# Patient Record
Sex: Female | Born: 1985 | Race: White | Hispanic: Yes | Marital: Single | Smoking: Never smoker
Health system: Southern US, Community
[De-identification: ages and names within clinical notes are randomized; demographics above are authoritative.]

## PROBLEM LIST (undated history)

## (undated) ENCOUNTER — Inpatient Hospital Stay (HOSPITAL_COMMUNITY): Payer: Self-pay

## (undated) DIAGNOSIS — A048 Other specified bacterial intestinal infections: Secondary | ICD-10-CM

## (undated) DIAGNOSIS — Z789 Other specified health status: Secondary | ICD-10-CM

## (undated) HISTORY — PX: BREAST CYST EXCISION: SHX579

---

## 2009-01-09 DIAGNOSIS — A048 Other specified bacterial intestinal infections: Secondary | ICD-10-CM

## 2009-01-09 HISTORY — DX: Other specified bacterial intestinal infections: A04.8

## 2013-02-02 LAB — OB RESULTS CONSOLE GBS: STREP GROUP B AG: NEGATIVE

## 2013-07-08 ENCOUNTER — Encounter (HOSPITAL_COMMUNITY): Payer: Self-pay | Admitting: Emergency Medicine

## 2013-07-08 ENCOUNTER — Emergency Department (HOSPITAL_COMMUNITY)
Admission: EM | Admit: 2013-07-08 | Discharge: 2013-07-08 | Disposition: A | Discharge status: Home / Self Care | Payer: Medicaid Other | Attending: Emergency Medicine | Admitting: Emergency Medicine

## 2013-07-08 ENCOUNTER — Emergency Department (HOSPITAL_COMMUNITY): Payer: Medicaid Other

## 2013-07-08 DIAGNOSIS — N390 Urinary tract infection, site not specified: Secondary | ICD-10-CM | POA: Diagnosis not present

## 2013-07-08 DIAGNOSIS — O9989 Other specified diseases and conditions complicating pregnancy, childbirth and the puerperium: Secondary | ICD-10-CM | POA: Insufficient documentation

## 2013-07-08 DIAGNOSIS — R109 Unspecified abdominal pain: Secondary | ICD-10-CM

## 2013-07-08 DIAGNOSIS — R1031 Right lower quadrant pain: Secondary | ICD-10-CM | POA: Diagnosis not present

## 2013-07-08 DIAGNOSIS — O2390 Unspecified genitourinary tract infection in pregnancy, unspecified trimester: Secondary | ICD-10-CM | POA: Insufficient documentation

## 2013-07-08 DIAGNOSIS — N898 Other specified noninflammatory disorders of vagina: Secondary | ICD-10-CM | POA: Insufficient documentation

## 2013-07-08 DIAGNOSIS — J029 Acute pharyngitis, unspecified: Secondary | ICD-10-CM | POA: Insufficient documentation

## 2013-07-08 DIAGNOSIS — O26899 Other specified pregnancy related conditions, unspecified trimester: Secondary | ICD-10-CM

## 2013-07-08 LAB — URINE MICROSCOPIC-ADD ON

## 2013-07-08 LAB — POC URINE PREG, ED: PREG TEST UR: POSITIVE — AB

## 2013-07-08 LAB — URINALYSIS, ROUTINE W REFLEX MICROSCOPIC
BILIRUBIN URINE: NEGATIVE
Glucose, UA: NEGATIVE mg/dL
Hgb urine dipstick: NEGATIVE
Ketones, ur: NEGATIVE mg/dL
Nitrite: NEGATIVE
PH: 6 (ref 5.0–8.0)
Protein, ur: NEGATIVE mg/dL
Specific Gravity, Urine: 1.027 (ref 1.005–1.030)
Urobilinogen, UA: 0.2 mg/dL (ref 0.0–1.0)

## 2013-07-08 MED ORDER — LIDOCAINE VISCOUS 2 % MT SOLN
15.0000 mL | Freq: Once | OROMUCOSAL | Status: AC
  Administered 2013-07-08: 15 mL via OROMUCOSAL
  Filled 2013-07-08: qty 15

## 2013-07-08 MED ORDER — NITROFURANTOIN MONOHYD MACRO 100 MG PO CAPS: 100.0000 mg | ORAL_CAPSULE | Freq: Two times a day (BID) | ORAL | Status: DC

## 2013-07-08 MED ORDER — NITROFURANTOIN MONOHYD MACRO 100 MG PO CAPS
100.0000 mg | ORAL_CAPSULE | Freq: Once | ORAL | Status: AC
  Administered 2013-07-08: 100 mg via ORAL
  Filled 2013-07-08 (×2): qty 1

## 2013-07-08 NOTE — ED Notes (Addendum)
Pt back from us

## 2013-07-08 NOTE — ED Notes (Signed)
Patient transported to Ultrasound 

## 2013-07-08 NOTE — ED Notes (Signed)
Pt pulled out to hallway per charge nurse.

## 2013-07-08 NOTE — ED Notes (Signed)
Patient declines wheelchair at discharge.  Patient escorted to lobby by RN.  

## 2013-07-08 NOTE — ED Provider Notes (Signed)
CSN: [id number]     Arrival date & time 07/08/13  [id number] History   First MD Initiated Contact with Patient 07/08/13 1115     Chief Complaint  Patient presents with  . Sore Throat  . Otalgia  . Possible Pregnancy  . Vaginal Discharge      HPI Patient presents multiple complaints. Patient is her primary concern is no throat, ear pain, cough. Since symptom onset approximately 3 days ago, patient has not taken any medication for symptom relief. She denies fevers, chills, vomiting, diarrhea. She states that she was generally well prior to this. Patient also knowledge is a smoker.  Approximately 6 weeks ago, with one home pregnancy test was positive. She denies vaginal bleeding, does complain of mild right lower quadrant pain.  History reviewed. No pertinent past medical history. History reviewed. No pertinent past surgical history. No family history on file. History  Substance Use Topics  . Smoking status: Never Smoker   . Smokeless tobacco: Not on file  . Alcohol Use: No   OB History   Grav Para Term Preterm Abortions TAB SAB Ect Mult Living                 Review of Systems  Constitutional:       Per HPI, otherwise negative  HENT:       Per HPI, otherwise negative  Respiratory:       Per HPI, otherwise negative  Cardiovascular:       Per HPI, otherwise negative  Gastrointestinal: Negative for vomiting.  Endocrine:       Negative aside from HPI  Genitourinary:       Neg aside from HPI   Musculoskeletal:       Per HPI, otherwise negative  Skin: Negative.   Neurological: Negative for syncope.      Allergies  Review of patient's allergies indicates no known allergies.  Home Medications   Prior to Admission medications   Medication Sig Start Date End Date Taking? Authorizing Provider  Docosahexaenoic Acid (DHA PO) Take 2 capsules by mouth daily.   Yes Historical Provider, MD  Multiple Vitamin (MULTIVITAMIN WITH MINERALS) TABS tablet Take 1 tablet by mouth daily.    Yes Historical Provider, MD   BP 109/64  Pulse 76  Temp(Src) 98.5 F (36.9 C) (Oral)  Resp 10  Ht 5\' 9"  (1.753 m)  Wt 153 lb (69.4 kg)  BMI 22.58 kg/m2  SpO2 100% Physical Exam  Nursing note and vitals reviewed. Constitutional: She is oriented to person, place, and time. She appears well-developed and well-nourished. No distress.  HENT:  Head: Normocephalic and atraumatic.  Eyes: Conjunctivae and EOM are normal.  Cardiovascular: Normal rate and regular rhythm.   Pulmonary/Chest: Effort normal and breath sounds normal. No stridor. No respiratory distress.  Abdominal: She exhibits no distension. There is no hepatosplenomegaly. There is tenderness in the right lower quadrant. There is no rigidity, no rebound and no guarding.  Musculoskeletal: She exhibits no edema.  Neurological: She is alert and oriented to person, place, and time. No cranial nerve deficit.  Skin: Skin is warm and dry.  Psychiatric: She has a normal mood and affect.    ED Course  Procedures (including critical care time) Labs Review Labs Reviewed  URINALYSIS, ROUTINE W REFLEX MICROSCOPIC - Abnormal; Notable for the following:    APPearance CLOUDY (*)    Leukocytes, UA MODERATE (*)    All other components within normal limits  URINE MICROSCOPIC-ADD ON - Abnormal; Notable for  the following:    Squamous Epithelial / LPF MANY (*)    Bacteria, UA FEW (*)    All other components within normal limits  POC URINE PREG, ED - Abnormal; Notable for the following:    Preg Test, Ur POSITIVE (*)    All other components within normal limits    Imaging Review US Ob Comp Less 14 Wks  07/08/2013   CLINICAL DATA:  Right lower quadrant pain, pregnant  EXAM: OBSTETRIC <14 WK [place] AND TRANSVAGINAL OB US  TECHNIQUE: Both transabdominal and transvaginal ultrasound examinations were performed for complete evaluation of the gestation as well as the maternal uterus, adnexal regions, and pelvic cul-de-sac. Transvaginal technique was  performed to assess early pregnancy.  COMPARISON:  None.  FINDINGS: Intrauterine gestational sac: Visualized/normal in shape.  Yolk sac:  Present  Embryo:  Present  Cardiac Activity: Present  Heart Rate:  119 bpm  CRL:   3.3  mm   6 w 0 d                  [place] EDC: 03/03/2014  Maternal uterus/adnexae: No subchronic hemorrhage.  Right ovary is within normal limits, measuring 2.8 x 1.5 x 2.4 cm.  Left ovary measures 3.5 x 2.0 x 1.4 cm and is notable for a 2.5 x 1.7 x 2.0 cm corpus luteal cyst.  No free fluid.  IMPRESSION: Single live intrauterine gestation with estimated gestational age [redacted] weeks 0 days by crown-rump length.   Electronically Signed   By: Charline Bills M.D.   On: 07/08/2013 14:39   US Ob Transvaginal  07/08/2013   CLINICAL DATA:  Right lower quadrant pain, pregnant  EXAM: OBSTETRIC <14 WK [place] AND TRANSVAGINAL OB US  TECHNIQUE: Both transabdominal and transvaginal ultrasound examinations were performed for complete evaluation of the gestation as well as the maternal uterus, adnexal regions, and pelvic cul-de-sac. Transvaginal technique was performed to assess early pregnancy.  COMPARISON:  None.  FINDINGS: Intrauterine gestational sac: Visualized/normal in shape.  Yolk sac:  Present  Embryo:  Present  Cardiac Activity: Present  Heart Rate:  119 bpm  CRL:   3.3  mm   6 w 0 d                  [place] EDC: 03/03/2014  Maternal uterus/adnexae: No subchronic hemorrhage.  Right ovary is within normal limits, measuring 2.8 x 1.5 x 2.4 cm.  Left ovary measures 3.5 x 2.0 x 1.4 cm and is notable for a 2.5 x 1.7 x 2.0 cm corpus luteal cyst.  No free fluid.  IMPRESSION: Single live intrauterine gestation with estimated gestational age [redacted] weeks 0 days by crown-rump length.   Electronically Signed   By: Charline Bills M.D.   On: 07/08/2013 14:39    With initial results positive for pregnancy, and the patient's right lower quadrant pain, and no prior OB care, patient will have ultrasound performed.  2:55 PM On  exam the patient is in no distress.  No new complaints.  We discussed ultrasound findings, urinalysis results, possibility of infection. Patient will followup at [facility] hospital for additional evaluation and management. Absent distress, with no fever, and no peritoneal findings, and no U/S E/O TOA, pelvic exam not performed to minimize infectious risk.  MDM   Patient presents early pregnancy with right lower quadrant pain.  The patient is awake alert, afebrile, in no distress.  No ultrasound evidence of tubo-ovarian abscess or other concerning findings. Patient is IUP.  The  patient was discharged in stable condition with medication for her pharyngitis, as well as [facility] hospital followup    Gerhard Munchobert Lockwood, MD 07/08/13 1458

## 2013-07-08 NOTE — ED Notes (Signed)
Pt moved to hallway to wait for U/S

## 2013-07-08 NOTE — ED Notes (Signed)
Pt presents to department for evaluation of sore throat, L ear pain, possible pregnancy and vaginal discharge. Pt states she had positive home pregnancy test. Also states abdominal cramping. Pt is alert and oriented x4. No signs of acute distress.

## 2013-07-08 NOTE — Discharge Instructions (Signed)
As discussed, with her pain, and urinary tract infection it is very important to follow up with [facility].  Please be sure to follow up for further evaluation and management.

## 2013-07-10 NOTE — Discharge Planning (Signed)
[facility] Community Liaison was not able to see patient, [facility] orange card information will be sent to the address provided. °

## 2013-08-20 ENCOUNTER — Encounter: Payer: Self-pay | Admitting: Advanced Practice Midwife

## 2013-09-02 ENCOUNTER — Other Ambulatory Visit: Payer: Self-pay

## 2013-09-02 LAB — OB RESULTS CONSOLE GC/CHLAMYDIA
CHLAMYDIA, DNA PROBE: NEGATIVE
Gonorrhea: NEGATIVE

## 2013-09-02 LAB — OB RESULTS CONSOLE HIV ANTIBODY (ROUTINE TESTING): HIV: NONREACTIVE

## 2013-09-02 LAB — OB RESULTS CONSOLE ABO/RH: RH Type: POSITIVE

## 2013-09-02 LAB — OB RESULTS CONSOLE HEPATITIS B SURFACE ANTIGEN: Hepatitis B Surface Ag: NEGATIVE

## 2013-09-02 LAB — OB RESULTS CONSOLE RUBELLA ANTIBODY, IGM: RUBELLA: IMMUNE

## 2013-09-02 LAB — OB RESULTS CONSOLE RPR: RPR: NONREACTIVE

## 2013-09-02 LAB — OB RESULTS CONSOLE ANTIBODY SCREEN: ANTIBODY SCREEN: NEGATIVE

## 2013-09-03 LAB — CYTOLOGY - PAP

## 2013-11-01 ENCOUNTER — Emergency Department (HOSPITAL_COMMUNITY): Payer: Medicaid Other

## 2013-11-01 ENCOUNTER — Emergency Department (HOSPITAL_COMMUNITY)
Admission: EM | Admit: 2013-11-01 | Discharge: 2013-11-01 | Disposition: A | Discharge status: Home / Self Care | Payer: Medicaid Other | Attending: Emergency Medicine | Admitting: Emergency Medicine

## 2013-11-01 ENCOUNTER — Encounter (HOSPITAL_COMMUNITY): Payer: Self-pay | Admitting: Emergency Medicine

## 2013-11-01 DIAGNOSIS — W010XXA Fall on same level from slipping, tripping and stumbling without subsequent striking against object, initial encounter: Secondary | ICD-10-CM | POA: Insufficient documentation

## 2013-11-01 DIAGNOSIS — Y9389 Activity, other specified: Secondary | ICD-10-CM | POA: Insufficient documentation

## 2013-11-01 DIAGNOSIS — Y9289 Other specified places as the place of occurrence of the external cause: Secondary | ICD-10-CM | POA: Insufficient documentation

## 2013-11-01 DIAGNOSIS — O9A212 Injury, poisoning and certain other consequences of external causes complicating pregnancy, second trimester: Secondary | ICD-10-CM | POA: Diagnosis present

## 2013-11-01 DIAGNOSIS — S59911A Unspecified injury of right forearm, initial encounter: Secondary | ICD-10-CM | POA: Diagnosis not present

## 2013-11-01 DIAGNOSIS — Z3A23 23 weeks gestation of pregnancy: Secondary | ICD-10-CM | POA: Diagnosis not present

## 2013-11-01 DIAGNOSIS — W19XXXA Unspecified fall, initial encounter: Secondary | ICD-10-CM

## 2013-11-01 DIAGNOSIS — Z79899 Other long term (current) drug therapy: Secondary | ICD-10-CM | POA: Insufficient documentation

## 2013-11-01 DIAGNOSIS — Z792 Long term (current) use of antibiotics: Secondary | ICD-10-CM | POA: Insufficient documentation

## 2013-11-01 DIAGNOSIS — M79601 Pain in right arm: Secondary | ICD-10-CM

## 2013-11-01 NOTE — ED Provider Notes (Signed)
CSN: [id number]     Arrival date & time 11/01/13  1450 History   First MD Initiated Contact with Patient 11/01/13 1505     Chief Complaint  Patient presents with  . Fall     (Consider location/radiation/quality/duration/timing/severity/associated sxs/prior Treatment) The history is provided by the patient and medical records.   This is a 28 year old female approximately [redacted] weeks gestation, presenting to the ED for a fall. Patient states she tripped over her nephew's tornadoes in the middle of the floor, and fell backwards impacting her right arm on a wall. She denies any head injury or loss of consciousness. Denies hitting her abdomen.  No current abdominal pain, vaginal bleeding, loss of fluid. Patient only complains of pain of her right forearm and right thumb. Patient is right-hand dominant. She denies any numbness or paresthesias.  No intervention tried PTA.  History reviewed. No pertinent past medical history. History reviewed. No pertinent past surgical history. History reviewed. No pertinent family history. History  Substance Use Topics  . Smoking status: Never Smoker   . Smokeless tobacco: Not on file  . Alcohol Use: No   OB History   Grav Para Term Preterm Abortions TAB SAB Ect Mult Living   1              Review of Systems  Musculoskeletal: Positive for arthralgias.  All other systems reviewed and are negative.     Allergies  Review of patient's allergies indicates no known allergies.  Home Medications   Prior to Admission medications   Medication Sig Start Date End Date Taking? Authorizing Provider  Docosahexaenoic Acid (DHA PO) Take 2 capsules by mouth daily.    Historical Provider, MD  Multiple Vitamin (MULTIVITAMIN WITH MINERALS) TABS tablet Take 1 tablet by mouth daily.    Historical Provider, MD  nitrofurantoin, macrocrystal-monohydrate, (MACROBID) 100 MG capsule Take 1 capsule (100 mg total) by mouth 2 (two) times daily. 07/08/13   Gerhard Munchobert Lockwood, MD    BP 108/57  Pulse 87  Temp(Src) 98.4 F (36.9 C) (Oral)  Resp 18  SpO2 99%  LMP 05/27/2013  Physical Exam  Nursing note and vitals reviewed. Constitutional: She is oriented to person, place, and time. She appears well-developed and well-nourished. No distress.  HENT:  Head: Normocephalic and atraumatic.  Mouth/Throat: Oropharynx is clear and moist.  Eyes: Conjunctivae and EOM are normal. Pupils are equal, round, and reactive to light.  Neck: Normal range of motion. Neck supple.  Cardiovascular: Normal rate, regular rhythm and normal heart sounds.   Pulmonary/Chest: Effort normal and breath sounds normal. No respiratory distress. She has no wheezes.  Abdominal: Soft. Bowel sounds are normal.  pregnant  Musculoskeletal: Normal range of motion.  Right forearm with small amount of bruising along ulnar aspect; small abrasion noted without active bleeding; no deformities noted Right thumb with TTP along base of digit; no gross deformities noted Normal grip strength; Strong radial pulse and cap refill; sensation intact diffusely throughout hand/arm  Neurological: She is alert and oriented to person, place, and time.  Skin: Skin is warm and dry. She is not diaphoretic.  Psychiatric: She has a normal mood and affect.    ED Course  Procedures (including critical care time) Labs Review Labs Reviewed - No data to display  Imaging Review No results found.   EKG Interpretation None      MDM   Final diagnoses:  Fall  Right arm pain   28 y.o. F with fall against wall after tripping over  a toy, impact on right arm.  No head injury or LOC.  No abdominal contact.  No vaginal bleeding or fluid loss. Denies abdominal pain.  Bedside ultrasound performed by myself, + cardiac activity and fetal movement is rapid.   Imaging of right forearm and hand obtained, no acute injuries identified.  Patient will be discharged home.  Encouraged RICE routine and tylenol as needed for pain.  FU with  PCP.  Discussed plan with patient, he/she acknowledged understanding and agreed with plan of care.  Return precautions given for new or worsening symptoms.  Garlon HatchetLisa M Reiley Bertagnolli, PA-C 11/01/13 1704

## 2013-11-01 NOTE — ED Notes (Signed)
Pt reports being approx [redacted] weeks pregnant. Pt tripped on a toy and fell backwards into the wall, denies hitting head or abd. Denies loc. Having right wrist pain, +fetal movement, denies abd pain.

## 2013-11-01 NOTE — ED Notes (Signed)
Patient transported to X-ray 

## 2013-11-01 NOTE — Discharge Instructions (Signed)
May take tylenol as needed for pain. Follow-up with primary care physician. Return to the ED for new concerns.

## 2013-11-01 NOTE — ED Notes (Signed)
Patient returned from X-ray 

## 2013-11-02 NOTE — ED Provider Notes (Signed)
Medical screening examination/treatment/procedure(s) were performed by non-physician practitioner and as supervising physician I was immediately available for consultation/collaboration.   EKG Interpretation None       Juliet RudeNathan R. Rubin PayorPickering, MD 11/02/13 0900

## 2013-11-11 ENCOUNTER — Encounter (HOSPITAL_COMMUNITY): Payer: Self-pay | Admitting: Emergency Medicine

## 2014-01-09 NOTE — L&D Delivery Note (Signed)
Pt was admitted in labor.She completed the first without difficulty. She had SROM. She pushed for 2 1/2 hours and then had a SVD of one live viable female infant in the ROA position over a second degree perineal tear. EBL-400cc. Tear closed with 3-0 chromic. Baby to NBN. Placenta -S/I.

## 2014-01-12 ENCOUNTER — Encounter (HOSPITAL_COMMUNITY): Payer: Self-pay | Admitting: Cardiology

## 2014-01-12 ENCOUNTER — Inpatient Hospital Stay (HOSPITAL_COMMUNITY)
Admission: EM | Admit: 2014-01-12 | Discharge: 2014-01-12 | Disposition: A | Discharge status: Home / Self Care | Payer: Medicaid Other | Attending: Obstetrics and Gynecology | Admitting: Obstetrics and Gynecology

## 2014-01-12 DIAGNOSIS — Z3A32 32 weeks gestation of pregnancy: Secondary | ICD-10-CM | POA: Diagnosis not present

## 2014-01-12 DIAGNOSIS — O212 Late vomiting of pregnancy: Secondary | ICD-10-CM | POA: Diagnosis not present

## 2014-01-12 DIAGNOSIS — R109 Unspecified abdominal pain: Secondary | ICD-10-CM | POA: Diagnosis present

## 2014-01-12 DIAGNOSIS — O26899 Other specified pregnancy related conditions, unspecified trimester: Secondary | ICD-10-CM

## 2014-01-12 HISTORY — DX: Other specified health status: Z78.9

## 2014-01-12 LAB — URINALYSIS, ROUTINE W REFLEX MICROSCOPIC
Bilirubin Urine: NEGATIVE
Glucose, UA: NEGATIVE mg/dL
Hgb urine dipstick: NEGATIVE
Ketones, ur: NEGATIVE mg/dL
NITRITE: NEGATIVE
PROTEIN: NEGATIVE mg/dL
Specific Gravity, Urine: 1.013 (ref 1.005–1.030)
UROBILINOGEN UA: 0.2 mg/dL (ref 0.0–1.0)
pH: 6 (ref 5.0–8.0)

## 2014-01-12 LAB — URINE MICROSCOPIC-ADD ON

## 2014-01-12 MED ORDER — BETAMETHASONE SOD PHOS & ACET 6 (3-3) MG/ML IJ SUSP
12.0000 mg | Freq: Once | INTRAMUSCULAR | Status: AC
  Administered 2014-01-12: 12 mg via INTRAMUSCULAR
  Filled 2014-01-12: qty 2

## 2014-01-12 MED ORDER — SODIUM CHLORIDE 0.9 % IV BOLUS (SEPSIS)
1000.0000 mL | Freq: Once | INTRAVENOUS | Status: AC
Start: 2014-01-12 — End: 2014-01-12
  Administered 2014-01-12: 1000 mL via INTRAVENOUS

## 2014-01-12 MED ORDER — NIFEDIPINE 10 MG PO CAPS
10.0000 mg | ORAL_CAPSULE | Freq: Once | ORAL | Status: AC
  Administered 2014-01-12: 10 mg via ORAL
  Filled 2014-01-12: qty 1

## 2014-01-12 MED ORDER — NIFEDIPINE 10 MG PO CAPS
20.0000 mg | ORAL_CAPSULE | Freq: Once | ORAL | Status: AC
  Administered 2014-01-12: 20 mg via ORAL
  Filled 2014-01-12: qty 2

## 2014-01-12 MED ORDER — LACTATED RINGERS IV SOLN
INTRAVENOUS | Status: DC
  Administered 2014-01-12: 16:00:00 via INTRAVENOUS

## 2014-01-12 NOTE — MAU Provider Note (Signed)
History     CSN: [id number]  Arrival date and time: 01/12/14 1200   First Provider Initiated Contact with Patient 01/12/14 1548      Chief Complaint  Patient presents with  . Abdominal Pain   HPI This is a 29 y.o. female at [redacted]w[redacted]d who presents via [facility] from ED for treatment of preterm labor. States does not feel contractions. Has some cramps but thinks they are upper abdomen. Does not feel tightening. Only 3 loose stools. Does not feel sick.   ER Note 1230 Arrived to evaluate this 28yo G1P0 @ 32.[redacted] wks GA in with report of abdominal pain in right suprapubic and mid epigastric area. Also reports nausea, vomiting, and diarrhea x 3-4 days. Vomiting x 1 and diarrhea x 3 in 24hrs. FHR Category I, frequent UI and occasional UC noted. 1310 Dr. Dareen Piano notified of patient in ED, of history of pregnancy, and of current complaint. Notified of results of EFM and of ED plan of care. Orders for SVE. If cervix is closed patient is OB cleared per Dr. Dareen Piano. Dr. Dareen Piano updated on SVE 1/70/-1 soft, mid position, vertex. Orders for transfer to MAU received. 1349 Report to MAU, Blanchie Serve, RN. Awaiting [facility] to transport.          OB History    Gravida Para Term Preterm AB TAB SAB Ectopic Multiple Living        Past Medical History  Diagnosis Date  . Medical history non-contributory     Past Surgical History  Procedure Laterality Date  . Breast cyst excision Left     History reviewed. No pertinent family history.  History  Substance Use Topics  . Smoking status: Never Smoker   . Smokeless tobacco: Not on file  . Alcohol Use: No    Allergies: No Known Allergies  Prescriptions prior to admission  Medication Sig Dispense Refill Last Dose  . Docosahexaenoic Acid (DHA PO) Take 1 capsule by mouth daily.    01/12/2014 at Unknown time  . Prenatal Vit-Fe Fumarate-FA (PRENATAL MULTIVITAMIN) TABS tablet Take 1 tablet by mouth daily at 12 noon.    01/12/2014 at Unknown time    Review of Systems  Constitutional: Negative for fever, chills and malaise/fatigue.  Gastrointestinal: Positive for abdominal pain and diarrhea. Negative for nausea, vomiting and constipation.  Genitourinary: Negative for dysuria.   Physical Exam   Blood pressure 120/72, pulse 71, temperature 98.7 F (37.1 C), temperature source Oral, resp. rate 16, height  (1.753 m), weight 170 lb (77.111 kg), last menstrual period 05/27/2013, SpO2 98 %.  Physical Exam  Constitutional: She is oriented to person, place, and time. She appears well-developed and well-nourished. No distress.  HENT:  Head: Normocephalic.  Cardiovascular: Normal rate and regular rhythm.   Respiratory: Effort normal.  GI: Soft. She exhibits no distension. There is tenderness (slightly tender upper abdomen). There is no rebound and no guarding.  Genitourinary:  Cervical exam done in ED  FHR reassuring Frequent uterine contractions and cramps (some short 20-30 seconds, occasionally long x 60 sec) Not felt by patient  Musculoskeletal: Normal range of motion.  Neurological: She is alert and oriented to person, place, and time.  Skin: Skin is warm and dry.  Psychiatric: She has a normal mood and affect.    MAU Course  Procedures  MDM Consulted Dr Dareen Piano. WIll give an extra liter of IVF and Procardia>> UCs persisted >> Procardia repeated >> persisted >> third  dose given >> UCs slowed. Patient has never really felt UCs. Cervix rechecked  Dilation: 1 Effacement (%): 80 Cervical Position: Middle Station: -1 Presentation: Vertex Exam by:: Rachel Foster, CNM  Discussed with Dr Dareen Piano >>  Will give Betamethasone and repeat tomorrow  Assessment and Plan  A:  SIUP at [redacted]w[redacted]d        Preterm Labor  P;  Discharge home       Bedrest and pelvic rest       Repeat Betamethasone tomorrow       Has appointment in office Wednesday. Informed they may check her again.    Rachel Foster 01/12/2014, 4:06 PM

## 2014-01-12 NOTE — Progress Notes (Addendum)
1230 Arrived to evaluate this 28yo G1P0 @ 32.[redacted] wks GA in with report of abdominal pain in right suprapubic and mid epigastric area.  Also reports nausea, vomiting, and diarrhea x 3-4 days.  Vomiting x 1 and diarrhea x 3 in 24hrs.  FHR Category I, frequent UI and occasional UC noted.  1310 Dr. Dareen Piano notified of patient in ED, of history of pregnancy, and of current complaint. Notified of results of EFM and of ED plan of care.  Orders for SVE.  If cervix is closed patient is OB cleared per Dr. Dareen Piano.  Dr. Dareen Piano updated on SVE 1/70/-1 soft, mid position, vertex.  Orders for transfer to MAU received.  1349 Report to MAU, Blanchie Serve, RN.  Awaiting [facility] to transport.

## 2014-01-12 NOTE — MAU Note (Signed)
Pt arrived via [facility] link from [facility], pt having upper and lower abdominal cramping x 4 days. Pt reports occasional nausea and vomiting, none today.

## 2014-01-12 NOTE — ED Provider Notes (Signed)
CSN: [id number]     Arrival date & time 01/12/14  1200 History   First MD Initiated Contact with Patient 01/12/14 1218     Chief Complaint  Patient presents with  . Abdominal Pain     (Consider location/radiation/quality/duration/timing/severity/associated sxs/prior Treatment) HPI Comments: 29 year old G1P0 female 8 months pregnant presenting with abdominal pain 3 days. She reports having sharp, intermittent cramping abdominal pain located in her right suprapubic area and midepigastric area. Currently the pain is not present in her suprapubic area, and only mild in the midepigastric area. No aggravating or alleviating factors. States she feels the baby moving frequently. Denies injury or trauma. Denies vaginal bleeding. Admits to associated nausea with a few episodes of nonbloody, nonbilious emesis along with diarrhea. She saw her OB/GYN a few weeks ago and everything was normal. Her last ultrasound was around 20 weeks and there were no complications. She denies having any complications with this pregnancy. Denies fever or chills.  Patient is a 29 y.o. female presenting with abdominal pain. The history is provided by the patient.  Abdominal Pain Associated symptoms: diarrhea, nausea and vomiting     Past Medical History  Diagnosis Date  . Medical history non-contributory    Past Surgical History  Procedure Laterality Date  . Breast cyst excision Left    History reviewed. No pertinent family history. History  Substance Use Topics  . Smoking status: Never Smoker   . Smokeless tobacco: Not on file  . Alcohol Use: No   OB History    Gravida Para Term Preterm AB TAB SAB Ectopic Multiple Living       Review of Systems  Gastrointestinal: Positive for nausea, vomiting, abdominal pain and diarrhea.  All other systems reviewed and are negative.     Allergies  Review of patient's allergies indicates no known allergies.  Home Medications   Prior to Admission  medications   Medication Sig Start Date End Date Taking? Authorizing Provider  Docosahexaenoic Acid (DHA PO) Take 1 capsule by mouth daily.    Yes Historical Provider, MD  Prenatal Vit-Fe Fumarate-FA (PRENATAL MULTIVITAMIN) TABS tablet Take 1 tablet by mouth daily at 12 noon.   Yes Historical Provider, MD   BP 118/74 mmHg  Pulse 65  Temp(Src) 98 F (36.7 C) (Oral)  Resp 19  Ht  (1.753 m)  Wt 170 lb (77.111 kg)  BMI 25.09 kg/m2  SpO2 100%  LMP 05/27/2013 Physical Exam  Constitutional: She is oriented to person, place, and time. She appears well-developed and well-nourished. No distress.  HENT:  Head: Normocephalic and atraumatic.  Mouth/Throat: Oropharynx is clear and moist.  Eyes: Conjunctivae are normal.  Neck: Normal range of motion. Neck supple.  Cardiovascular: Normal rate, regular rhythm and normal heart sounds.   Pulmonary/Chest: Effort normal and breath sounds normal.  Abdominal: Soft. Bowel sounds are normal. There is no tenderness.  Gravid abdomen.  Musculoskeletal: Normal range of motion. She exhibits no edema.  Neurological: She is alert and oriented to person, place, and time.  Skin: Skin is warm and dry. She is not diaphoretic.  Psychiatric: She has a normal mood and affect. Her behavior is normal.  Nursing note and vitals reviewed.   ED Course  Procedures (including [id number] time) Labs Review Labs Reviewed  URINALYSIS, ROUTINE W REFLEX MICROSCOPIC    Imaging Review No results found.   EKG Interpretation None      MDM   Final diagnoses:  Abdominal pain during pregnancy   Pt 8 months pregnant with abdominal pain/cramping. NAD. VSS. No vaginal bleeding. Abdomen non-tender. Rapid response ob nurse present, fetal heart tones normal, good movement, few small contractions. Cervix checked by rapid response nurse per request of Dr. Dareen Piano, pt 1 cm dilated with thinned out cervix. Pt will be transferred to [facility] for further care,  most likely to be given procardia. Transfer accepted by Dr. Dareen Piano. Pt stable for transfer.  Discussed with attending Dr. Loretha Stapler who also evaluated patient and agrees with plan of care.    Kathrynn Speed, PA-C 01/13/14 1526  Merrie Roof, MD 01/14/14 [phone]

## 2014-01-12 NOTE — ED Notes (Addendum)
Robyn PA at bedside. Pt placed on fetal heart monitor and toco monitor.

## 2014-01-12 NOTE — ED Notes (Signed)
Misty Stanley RN -Rapid OB at bedside.

## 2014-01-12 NOTE — Discharge Instructions (Signed)

## 2014-01-12 NOTE — ED Notes (Signed)
Pt reports that she is 8 months pregnant and having sharp cramping pain for the past 3 days. Pt reports that this is her first baby. Denies any bleeding.

## 2014-01-13 ENCOUNTER — Inpatient Hospital Stay (HOSPITAL_COMMUNITY)
Admission: AD | Admit: 2014-01-13 | Discharge: 2014-01-13 | Disposition: A | Discharge status: Home / Self Care | Payer: Medicaid Other | Source: Ambulatory Visit | Attending: Obstetrics & Gynecology | Admitting: Obstetrics & Gynecology

## 2014-01-13 DIAGNOSIS — Z3A33 33 weeks gestation of pregnancy: Secondary | ICD-10-CM | POA: Diagnosis not present

## 2014-01-13 MED ORDER — BETAMETHASONE SOD PHOS & ACET 6 (3-3) MG/ML IJ SUSP: 12.0000 mg | Freq: Once | INTRAMUSCULAR | Status: DC

## 2014-01-13 MED ORDER — BETAMETHASONE SOD PHOS & ACET 6 (3-3) MG/ML IJ SUSP
12.0000 mg | Freq: Once | INTRAMUSCULAR | Status: AC
  Administered 2014-01-13: 12 mg via INTRAMUSCULAR
  Filled 2014-01-13: qty 2

## 2014-01-13 NOTE — MAU Note (Signed)
Some cramping today, little bit of brown spotting after exams.

## 2014-01-14 NOTE — ED Provider Notes (Signed)
Medical screening examination/treatment/procedure(s) were conducted as a shared visit with non-physician practitioner(s) and myself.  I personally evaluated the patient during the encounter.   EKG Interpretation None      [age] year old female who was in her third trimester presenting with intermittent lower abdominal pain.  On exam, well appearing, nontoxic, not distressed, normal respiratory effort, normal perfusion, abdomen gravid with minimal left lower quadrant and right lower quadrant tenderness. OB recommends transfer to MAU for further obstetrical evaluation..  Clinical Impression: Abdominal pain during pregnancy  Candyce ChurnJohn David Dawson Albers III, MD 01/14/14 2348

## 2014-01-25 ENCOUNTER — Inpatient Hospital Stay (HOSPITAL_COMMUNITY): Payer: Medicaid Other

## 2014-01-25 ENCOUNTER — Encounter (HOSPITAL_COMMUNITY): Payer: Self-pay | Admitting: *Deleted

## 2014-01-25 ENCOUNTER — Inpatient Hospital Stay (HOSPITAL_COMMUNITY)
Admission: AD | Admit: 2014-01-25 | Discharge: 2014-01-25 | Disposition: A | Discharge status: Home / Self Care | Payer: Medicaid Other | Source: Ambulatory Visit | Attending: Obstetrics and Gynecology | Admitting: Obstetrics and Gynecology

## 2014-01-25 DIAGNOSIS — W19XXXA Unspecified fall, initial encounter: Secondary | ICD-10-CM | POA: Insufficient documentation

## 2014-01-25 DIAGNOSIS — O368132 Decreased fetal movements, third trimester, fetus 2: Secondary | ICD-10-CM

## 2014-01-25 DIAGNOSIS — O368130 Decreased fetal movements, third trimester, not applicable or unspecified: Secondary | ICD-10-CM | POA: Diagnosis present

## 2014-01-25 DIAGNOSIS — Z3A34 34 weeks gestation of pregnancy: Secondary | ICD-10-CM | POA: Diagnosis not present

## 2014-01-25 DIAGNOSIS — M79641 Pain in right hand: Secondary | ICD-10-CM | POA: Insufficient documentation

## 2014-01-25 DIAGNOSIS — O368190 Decreased fetal movements, unspecified trimester, not applicable or unspecified: Secondary | ICD-10-CM | POA: Insufficient documentation

## 2014-01-25 LAB — URINALYSIS, ROUTINE W REFLEX MICROSCOPIC
BILIRUBIN URINE: NEGATIVE
GLUCOSE, UA: 500 mg/dL — AB
Ketones, ur: NEGATIVE mg/dL
Leukocytes, UA: NEGATIVE
NITRITE: NEGATIVE
PH: 5.5 (ref 5.0–8.0)
PROTEIN: NEGATIVE mg/dL
SPECIFIC GRAVITY, URINE: 1.01 (ref 1.005–1.030)
Urobilinogen, UA: 0.2 mg/dL (ref 0.0–1.0)

## 2014-01-25 LAB — URINE MICROSCOPIC-ADD ON

## 2014-01-25 NOTE — MAU Note (Signed)
Pt presents to MAU with complaints of a decrease in fetal movement since yesterday with pain in the right side of her abdomen. Pt also reports that she feel yesterday and landed on her hand. Denies any vaginal bleeding or LOF

## 2014-01-25 NOTE — Discharge Instructions (Signed)
Fetal Biophysical Profile °This is a test that measures five different variables of the fetus: Heart rate, breathing movement, total movement of the baby, fetal muscle tone, the amount of amniotic fluid, and the heart rate activity of the fetus. The five variables are measured individually and contribute either a 2 or a 0 to the overall scoring of the test. The measurements are as follows: °· Fetal heart rate activity. This is measured and scored in the same way as a non-stress test. The fetal heart rate is considered reactive when there are movement-associated fetal heart rate increases of at least 15 beats per minute above baseline, and 15 seconds in duration over a 20-minute period. A score of 2 is given for reactivity, and a score of 0 indicates that the fetal heart rate is non-reactive. °· Fetal breathing movements. This is scored based on fetal breathing movements and indicate fetal well-being. Their absence may indicate a low oxygen level for the fetus. Fetal breathing increases in frequency and uniformity after the 36th week of pregnancy. To earn a score of 2, the fetus must have at least one episode of fetal breathing lasting at least 60 seconds within a 30-minute observation. Absence of this breathing is scored a 0 on the BPP. °· Fetal body movements. Fetal activity is a reflection of brain integrity and function. The presence of at least three episodes of fetal movements within a 30-minute period is given a score of 2. A score of 0 is given with two or less movements in this time period. Fetal activity is highest 1 to 3 hours after the mother has eaten a meal. °· Fetal tone. In the uterus, the fetus is normally in a position of flexion. This means the head is bent down towards the knees. The fetus also stretches, rolls, and moves in the uterus. The arms, legs, trunk, and head may be flexed and extended. A score of 2 is earned when there is at least one episode of active extension with return flexion. A  score of 0 is given for slow extension with a return to only partial flexion. Fetal movement not followed by return to flexion, limbs or spine in extension, and an open fetal hand score 0. °· Amniotic fluid volume. Amniotic fluid volume has been demonstrated to be a good method of predicting fetal distress. Too little amniotic fluid has been associated with fetal abnormalities, slow uterine growth, and over due pregnancy. A score of 2 is given for this when there is at least one pocket of amniotic fluid that measures 1 cm in a specific area. A score of 0 indicates either that fluid is absent in most areas of the uterine cavity or that the largest pocket of fluid measures less than 1 cm. °PREPARATION FOR TEST °No preparation or fasting is necessary. °NORMAL FINDINGS °· A score of 8-10 points (if amniotic fluid volume is adequate). °· Possible critical values: Less than 4 may necessitate immediate delivery of fetus. °Ranges for normal findings may vary among different laboratories and hospitals. You should always check with your doctor after having lab work or other tests done to discuss the meaning of your test results and whether your values are considered within normal limits. °MEANING OF TEST  °Your caregiver will go over the test results with you and discuss the importance and meaning of your results, as well as treatment options and the need for additional tests if necessary. °OBTAINING THE TEST RESULTS  °It is your responsibility to obtain your test   results. Ask the lab or department performing the test when and how you will get your results. °Document Released: 04/28/2004 Document Revised: 03/20/2011 Document Reviewed: 12/06/2007 °ExitCare® Patient Information ©2015 [employer]. This information is not intended to replace advice given to you by your health care provider. Make sure you discuss any questions you have with your health care provider. ° °

## 2014-01-25 NOTE — MAU Provider Note (Signed)
CSN: [id number]     Arrival date & time 01/25/14  1724 History   None    Chief Complaint  Patient presents with  . Decreased Fetal Movement  . Abdominal Pain     (Consider location/radiation/quality/duration/timing/severity/associated sxs/prior Treatment) HPI Sani Penick is a 29 y.o.  G1P0000 at [redacted]w[redacted]d. She fell yesterday, landed on her r hand. Since  that time has not felt fetal movement as much as usual. Her right side is sore and tender. No bleeding or leaking, no regular contractions. She has Procardia to take at home as needed, last used 3 d ago.  Past Medical History  Diagnosis Date  . Medical history non-contributory    Past Surgical History  Procedure Laterality Date  . Breast cyst excision Left    History reviewed. No pertinent family history. History  Substance Use Topics  . Smoking status: Never Smoker   . Smokeless tobacco: Not on file  . Alcohol Use: No   OB History    Gravida Para Term Preterm AB TAB SAB Ectopic Multiple Living       Review of Systems  Constitutional: Negative for fever and chills.  Gastrointestinal: Negative for nausea and vomiting.  Genitourinary: Negative for dysuria, urgency, frequency, vaginal bleeding and vaginal discharge.  Musculoskeletal:       Right side sore      Allergies  Review of patient's allergies indicates no known allergies.  Home Medications   Prior to Admission medications   Medication Sig Start Date End Date Taking? Authorizing Provider  Amoxicill-Clarithro-Omeprazole (OMECLAMOX-PAK PO) Take 4 tablets by mouth 2 (two) times daily.   Yes Historical Provider, MD  Docosahexaenoic Acid (DHA PO) Take 1 capsule by mouth daily.    Yes Historical Provider, MD  Prenatal Vit-Fe Fumarate-FA (PRENATAL MULTIVITAMIN) TABS tablet Take 1 tablet by mouth daily.    Yes Historical Provider, MD   BP 127/79 mmHg  Pulse 107  Temp(Src) 98.5 F (36.9 C)  Resp 18  LMP 05/27/2013 Physical Exam   Constitutional: She is oriented to person, place, and time. She appears well-developed and well-nourished.  Abdominal: Soft. There is no tenderness.  Genitourinary:  Cervical exam by RN 1 cm, 40-50%, -1  Musculoskeletal: Normal range of motion.  Neurological: She is alert and oriented to person, place, and time.  Skin: Skin is warm and dry.  Psychiatric: She has a normal mood and affect. Her behavior is normal.    ED Course  Procedures (including critical care time) Labs Review Labs Reviewed  URINALYSIS, ROUTINE W REFLEX MICROSCOPIC    Imaging Review No results found.   EKG Interpretation None      MDM  Consulted with Dr Henderson Cloud- get BP and AFI, give Procardia for irritability. Pt declines Procardia, doesn't like how it makes her feel- gives her a migraine Results for orders placed or performed during the hospital encounter of 01/25/14 (from the past 24 hour(s))  Urinalysis, Routine w reflex microscopic     Status: Abnormal   Collection Time: 01/25/14  5:30 PM  Result Value Ref Range   Color, Urine YELLOW YELLOW   APPearance CLEAR CLEAR   Specific Gravity, Urine 1.010 1.005 - 1.030   pH 5.5 5.0 - 8.0   Glucose, UA 500 (A) NEGATIVE mg/dL   Hgb urine dipstick TRACE (A) NEGATIVE   Bilirubin Urine NEGATIVE NEGATIVE   Ketones, ur NEGATIVE NEGATIVE mg/dL   Protein, ur NEGATIVE NEGATIVE mg/dL   Urobilinogen, UA  0.2 0.0 - 1.0 mg/dL   Nitrite NEGATIVE NEGATIVE   Leukocytes, UA NEGATIVE NEGATIVE  Urine microscopic-add on     Status: Abnormal   Collection Time: 01/25/14  5:30 PM  Result Value Ref Range   Squamous Epithelial / LPF FEW (A) RARE   WBC, UA 0-2 <3 WBC/hpf   RBC / HPF 0-2 <3 RBC/hpf   Bacteria, UA FEW (A) RARE   BPP- 6/8- for breathing AFI 13.89  Final diagnoses:  None    A- 34 5/[redacted] wks EGA, reactive strip. BPP 6/8, AFI 13.89   P- Precautions reviewed Take Procardia if needed Keep appt for prenatal care

## 2014-02-21 ENCOUNTER — Encounter (HOSPITAL_COMMUNITY): Payer: Self-pay

## 2014-02-21 ENCOUNTER — Inpatient Hospital Stay (HOSPITAL_COMMUNITY)
Admission: AD | Admit: 2014-02-21 | Discharge: 2014-02-21 | Disposition: A | Discharge status: Home / Self Care | Payer: Medicaid Other | Source: Ambulatory Visit | Attending: Obstetrics and Gynecology | Admitting: Obstetrics and Gynecology

## 2014-02-21 ENCOUNTER — Encounter (HOSPITAL_COMMUNITY): Payer: Self-pay | Admitting: General Practice

## 2014-02-21 ENCOUNTER — Inpatient Hospital Stay (HOSPITAL_COMMUNITY)
Admission: AD | Admit: 2014-02-21 | Discharge: 2014-02-24 | DRG: 775 | Disposition: A | Discharge status: Home / Self Care | Payer: Medicaid Other | Source: Ambulatory Visit | Attending: Obstetrics and Gynecology | Admitting: Obstetrics and Gynecology

## 2014-02-21 ENCOUNTER — Inpatient Hospital Stay (HOSPITAL_COMMUNITY): Payer: Medicaid Other | Admitting: Anesthesiology

## 2014-02-21 DIAGNOSIS — Z3480 Encounter for supervision of other normal pregnancy, unspecified trimester: Secondary | ICD-10-CM

## 2014-02-21 DIAGNOSIS — IMO0001 Reserved for inherently not codable concepts without codable children: Secondary | ICD-10-CM

## 2014-02-21 DIAGNOSIS — E221 Hyperprolactinemia: Secondary | ICD-10-CM | POA: Diagnosis present

## 2014-02-21 DIAGNOSIS — Z3A38 38 weeks gestation of pregnancy: Secondary | ICD-10-CM | POA: Diagnosis present

## 2014-02-21 DIAGNOSIS — O471 False labor at or after 37 completed weeks of gestation: Secondary | ICD-10-CM | POA: Diagnosis present

## 2014-02-21 HISTORY — DX: Other specified bacterial intestinal infections: A04.8

## 2014-02-21 LAB — CBC
HCT: 37.7 % (ref 36.0–46.0)
Hemoglobin: 13.1 g/dL (ref 12.0–15.0)
MCH: 31.6 pg (ref 26.0–34.0)
MCHC: 34.7 g/dL (ref 30.0–36.0)
MCV: 91.1 fL (ref 78.0–100.0)
Platelets: 180 10*3/uL (ref 150–400)
RBC: 4.14 MIL/uL (ref 3.87–5.11)
RDW: 14 % (ref 11.5–15.5)
WBC: 15.6 10*3/uL — AB (ref 4.0–10.5)

## 2014-02-21 LAB — TYPE AND SCREEN
ABO/RH(D): A POS
Antibody Screen: NEGATIVE

## 2014-02-21 LAB — ABO/RH: ABO/RH(D): A POS

## 2014-02-21 LAB — RAPID HIV SCREEN (WH-MAU): SUDS RAPID HIV SCREEN: NONREACTIVE

## 2014-02-21 MED ORDER — LACTATED RINGERS IV SOLN
INTRAVENOUS | Status: DC
Start: 2014-02-21 — End: 2014-02-24
  Administered 2014-02-21 (×2): via INTRAVENOUS

## 2014-02-21 MED ORDER — PHENYLEPHRINE 40 MCG/ML (10ML) SYRINGE FOR IV PUSH (FOR BLOOD PRESSURE SUPPORT)
80.0000 ug | PREFILLED_SYRINGE | INTRAVENOUS | Status: DC | PRN
  Filled 2014-02-21: qty 2

## 2014-02-21 MED ORDER — PHENYLEPHRINE 40 MCG/ML (10ML) SYRINGE FOR IV PUSH (FOR BLOOD PRESSURE SUPPORT)
80.0000 ug | PREFILLED_SYRINGE | INTRAVENOUS | Status: DC | PRN
  Filled 2014-02-21: qty 20
  Filled 2014-02-21: qty 2

## 2014-02-21 MED ORDER — LACTATED RINGERS IV SOLN: 500.0000 mL | Freq: Once | INTRAVENOUS | Status: DC

## 2014-02-21 MED ORDER — EPHEDRINE 5 MG/ML INJ
10.0000 mg | INTRAVENOUS | Status: DC | PRN
  Filled 2014-02-21: qty 2

## 2014-02-21 MED ORDER — FENTANYL 2.5 MCG/ML BUPIVACAINE 1/10 % EPIDURAL INFUSION (WH - ANES)
INTRAMUSCULAR | Status: DC | PRN
  Administered 2014-02-21: 14 mL/h via EPIDURAL

## 2014-02-21 MED ORDER — OXYTOCIN 40 UNITS IN LACTATED RINGERS INFUSION - SIMPLE MED
62.5000 mL/h | INTRAVENOUS | Status: DC
Start: 2014-02-21 — End: 2014-02-24

## 2014-02-21 MED ORDER — FENTANYL 2.5 MCG/ML BUPIVACAINE 1/10 % EPIDURAL INFUSION (WH - ANES)
14.0000 mL/h | INTRAMUSCULAR | Status: DC | PRN
  Administered 2014-02-21: 14 mL/h via EPIDURAL
  Filled 2014-02-21: qty 125

## 2014-02-21 MED ORDER — LACTATED RINGERS IV SOLN: 500.0000 mL | INTRAVENOUS | Status: DC | PRN

## 2014-02-21 MED ORDER — ONDANSETRON HCL 4 MG/2ML IJ SOLN: 4.0000 mg | Freq: Four times a day (QID) | INTRAMUSCULAR | Status: DC | PRN

## 2014-02-21 MED ORDER — OXYCODONE-ACETAMINOPHEN 5-325 MG PO TABS
1.0000 | ORAL_TABLET | ORAL | Status: DC | PRN
Start: 2014-02-21 — End: 2014-02-24
  Filled 2014-02-21 (×5): qty 1

## 2014-02-21 MED ORDER — OXYCODONE-ACETAMINOPHEN 5-325 MG PO TABS: 2.0000 | ORAL_TABLET | ORAL | Status: DC | PRN

## 2014-02-21 MED ORDER — LIDOCAINE HCL (PF) 1 % IJ SOLN
INTRAMUSCULAR | Status: DC | PRN
  Administered 2014-02-21: 3 mL
  Administered 2014-02-21 (×2): 5 mL

## 2014-02-21 MED ORDER — CITRIC ACID-SODIUM CITRATE 334-500 MG/5ML PO SOLN: 30.0000 mL | ORAL | Status: DC | PRN

## 2014-02-21 MED ORDER — OXYTOCIN BOLUS FROM INFUSION
500.0000 mL | INTRAVENOUS | Status: DC
  Administered 2014-02-22: 500 mL via INTRAVENOUS

## 2014-02-21 MED ORDER — ACETAMINOPHEN 325 MG PO TABS: 650.0000 mg | ORAL_TABLET | ORAL | Status: DC | PRN

## 2014-02-21 MED ORDER — OXYTOCIN 40 UNITS IN LACTATED RINGERS INFUSION - SIMPLE MED
INTRAVENOUS | Status: AC
  Filled 2014-02-21: qty 1000

## 2014-02-21 MED ORDER — LIDOCAINE HCL (PF) 1 % IJ SOLN
30.0000 mL | INTRAMUSCULAR | Status: DC | PRN
  Filled 2014-02-21: qty 30

## 2014-02-21 MED ORDER — DIPHENHYDRAMINE HCL 50 MG/ML IJ SOLN: 12.5000 mg | INTRAMUSCULAR | Status: DC | PRN

## 2014-02-21 MED ORDER — LIDOCAINE HCL (PF) 1 % IJ SOLN
INTRAMUSCULAR | Status: AC
  Administered 2014-02-22: 30 mL
  Filled 2014-02-21: qty 30

## 2014-02-21 NOTE — H&P (Signed)
Pt is a 29 y/o female G1P0 at term who presents to the ER in labor. She was 6cm on admission. PNC was complicated by hyperprolactinemia (did not take her meds with preg) and an abnormal pap. PMHX" See prenatal record PE: VSSAF HEENT-wnl ABD-gravid, non tender IMP/ IUP at term in labor         hyperprolactinemia          Abnormal pap Plan/ Admit

## 2014-02-21 NOTE — Progress Notes (Signed)
Dr Dareen PianoAnderson notified of pt's VE, FHR pattern, contraction pattern orders received

## 2014-02-21 NOTE — MAU Note (Signed)
Per HMitchell, RN charge, pt to go to room 163.  

## 2014-02-21 NOTE — MAU Note (Signed)
Pt presents to MAU with complaints of contractions that started last night. Denies any vaginal bleeding, reports scant amount of discharge

## 2014-02-21 NOTE — MAU Note (Signed)
Pt presents with worsening contractions since this morning. Denies vaginal bleeding, discharge, or leaking of fluid. Reports good fetal movement.

## 2014-02-21 NOTE — Progress Notes (Signed)
Notified of pt's cervical exam, wants epidural, orders obtained.

## 2014-02-21 NOTE — Anesthesia Procedure Notes (Signed)
Epidural Patient location during procedure: OB  Staffing Anesthesiologist: Rachel GroutARIGNAN, Rachel Crill Performed by: anesthesiologist   Preanesthetic Checklist Completed: patient identified, site marked, surgical consent, pre-op evaluation, timeout performed, IV checked, risks and benefits discussed and monitors and equipment checked  Epidural Patient position: sitting Prep: ChloraPrep Patient monitoring: heart rate, continuous pulse ox and blood pressure Approach: left paramedian Location: L2-L3 Injection technique: LOR saline  Needle:  Needle type: Tuohy  Needle gauge: 17 G Needle length: 9 cm and 9 Needle insertion depth: 5 cm Catheter type: closed end flexible Catheter size: 20 Guage Catheter at skin depth: 10 cm Test dose: negative  Assessment Events: blood not aspirated, injection not painful, no injection resistance, negative IV test and no paresthesia  Additional Notes   Patient tolerated the insertion well without complications.

## 2014-02-21 NOTE — Discharge Instructions (Signed)

## 2014-02-21 NOTE — Anesthesia Preprocedure Evaluation (Signed)

## 2014-02-22 ENCOUNTER — Encounter (HOSPITAL_COMMUNITY): Payer: Self-pay | Admitting: *Deleted

## 2014-02-22 DIAGNOSIS — Z3480 Encounter for supervision of other normal pregnancy, unspecified trimester: Secondary | ICD-10-CM

## 2014-02-22 MED ORDER — SENNOSIDES-DOCUSATE SODIUM 8.6-50 MG PO TABS
2.0000 | ORAL_TABLET | ORAL | Status: DC
  Administered 2014-02-22 – 2014-02-24 (×2): 2 via ORAL
  Filled 2014-02-22 (×2): qty 2

## 2014-02-22 MED ORDER — TETANUS-DIPHTH-ACELL PERTUSSIS 5-2.5-18.5 LF-MCG/0.5 IM SUSP: 0.5000 mL | Freq: Once | INTRAMUSCULAR | Status: DC

## 2014-02-22 MED ORDER — BENZOCAINE-MENTHOL 20-0.5 % EX AERO
1.0000 "application " | INHALATION_SPRAY | CUTANEOUS | Status: DC | PRN
  Administered 2014-02-24: 1 via TOPICAL
  Filled 2014-02-22 (×2): qty 56

## 2014-02-22 MED ORDER — ONDANSETRON HCL 4 MG/2ML IJ SOLN: 4.0000 mg | INTRAMUSCULAR | Status: DC | PRN

## 2014-02-22 MED ORDER — SIMETHICONE 80 MG PO CHEW: 80.0000 mg | CHEWABLE_TABLET | ORAL | Status: DC | PRN

## 2014-02-22 MED ORDER — OXYCODONE-ACETAMINOPHEN 5-325 MG PO TABS
1.0000 | ORAL_TABLET | ORAL | Status: DC | PRN
  Administered 2014-02-22 – 2014-02-24 (×7): 1 via ORAL
  Filled 2014-02-22 (×2): qty 1

## 2014-02-22 MED ORDER — ONDANSETRON HCL 4 MG PO TABS: 4.0000 mg | ORAL_TABLET | ORAL | Status: DC | PRN

## 2014-02-22 MED ORDER — DIBUCAINE 1 % RE OINT
1.0000 "application " | TOPICAL_OINTMENT | RECTAL | Status: DC | PRN
  Administered 2014-02-23: 1 via RECTAL
  Filled 2014-02-22: qty 28

## 2014-02-22 MED ORDER — WITCH HAZEL-GLYCERIN EX PADS
1.0000 "application " | MEDICATED_PAD | CUTANEOUS | Status: DC | PRN
  Administered 2014-02-23: 1 via TOPICAL

## 2014-02-22 MED ORDER — OXYCODONE-ACETAMINOPHEN 5-325 MG PO TABS: 2.0000 | ORAL_TABLET | ORAL | Status: DC | PRN

## 2014-02-22 MED ORDER — MEASLES, MUMPS & RUBELLA VAC ~~LOC~~ INJ
0.5000 mL | INJECTION | Freq: Once | SUBCUTANEOUS | Status: DC
  Filled 2014-02-22: qty 0.5

## 2014-02-22 MED ORDER — ZOLPIDEM TARTRATE 5 MG PO TABS: 5.0000 mg | ORAL_TABLET | Freq: Every evening | ORAL | Status: DC | PRN

## 2014-02-22 MED ORDER — IBUPROFEN 600 MG PO TABS
600.0000 mg | ORAL_TABLET | Freq: Four times a day (QID) | ORAL | Status: DC
  Administered 2014-02-22 – 2014-02-24 (×10): 600 mg via ORAL
  Filled 2014-02-22 (×10): qty 1

## 2014-02-22 NOTE — Lactation Note (Signed)
This note was copied from the chart of Rachel Foster. Lactation Consultation Note: Initial visit with mom. Baby 13 hours old. Mom reports that baby fed great after delivery and once since then but has been sleepy since. Encouraged STS as much as possible and reviewed feeding cues and encouraged to feed whenever she sees them. BF brochure given, Reviewed BFSG and OP appointments as resources for support after DC. Mom asking about pumping- encouraged to wait a few [name] unless baby not nursing. No further questions at present Room full of visitors and mom reports she tried to latch about 1 hour ago. Encouraged to call for assist prn  Patient Name: Rachel Linward [name] ZOXWR'UToday's Date: 02/22/2014 Reason for consult: Initial assessment   Maternal Data Formula Feeding for Exclusion: No Has patient been taught Hand Expression?: Yes Does the patient have breastfeeding experience prior to this delivery?: No  Feeding   LATCH Score/Interventions                      Lactation Tools Discussed/Used     Consult Status Consult Status: Follow-up Date: 02/23/14 Follow-up type: In-patient    Pamelia HoitWeeks, Trinnity Breunig D 02/22/2014, 3:13 PM

## 2014-02-22 NOTE — Anesthesia Postprocedure Evaluation (Signed)
  Anesthesia Post-op Note  Patient: Rachel HeadlandChrissy Foster  Procedure(s) Performed: * No procedures listed *  Patient Location: Mother/Baby  Anesthesia Type:Epidural  Level of Consciousness: awake  Airway and Oxygen Therapy: Patient Spontanous Breathing  Post-op Pain: mild  Post-op Assessment: Patient's Cardiovascular Status Stable and Respiratory Function Stable  Post-op Vital Signs: stable  Last Vitals:  Filed Vitals:   02/22/14 0920  BP: 112/62  Pulse: 79  Temp: 36.4 C  Resp: 20    Complications: No apparent anesthesia complications

## 2014-02-23 DIAGNOSIS — IMO0001 Reserved for inherently not codable concepts without codable children: Secondary | ICD-10-CM

## 2014-02-23 LAB — RPR: RPR Ser Ql: NONREACTIVE

## 2014-02-23 NOTE — Progress Notes (Signed)
UR chart review completed.  

## 2014-02-23 NOTE — Progress Notes (Signed)
Post Partum Day 1 Subjective: no complaints, up ad lib, voiding, tolerating PO, + flatus and breast feeding  Objective: Blood pressure 108/55, pulse 67, temperature 97.9 F (36.6 C), temperature source Oral, resp. rate 18, height 5\' 9"  (1.753 m), weight 78.472 kg (173 lb), last menstrual period 05/27/2013, SpO2 98 %, unknown if currently breastfeeding.  Physical Exam:  General: alert, cooperative and no distress Lochia: appropriate Uterine Fundus: firm perineum: healing well, no significant drainage, no dehiscence DVT Evaluation: No evidence of DVT seen on physical exam. Negative Homan's sign. No cords or calf tenderness. No significant calf/ankle edema.   Recent Labs  02/21/14 1825  HGB 13.1  HCT 37.7    Assessment/Plan: Plan for discharge tomorrow and Breastfeeding   LOS: 2 days   Rachel Foster STACIA 02/23/2014, 9:11 AM

## 2014-02-24 MED ORDER — IBUPROFEN 600 MG PO TABS
600.0000 mg | ORAL_TABLET | Freq: Four times a day (QID) | ORAL | Status: DC | PRN
Start: 2014-02-24 — End: 2014-10-05

## 2014-02-24 MED ORDER — DOCUSATE SODIUM 100 MG PO CAPS: 100.0000 mg | ORAL_CAPSULE | Freq: Two times a day (BID) | ORAL | Status: DC | PRN

## 2014-02-24 MED ORDER — OXYCODONE-ACETAMINOPHEN 5-325 MG PO TABS: 2.0000 | ORAL_TABLET | ORAL | Status: DC | PRN

## 2014-02-24 NOTE — Discharge Summary (Signed)
Obstetric Discharge Summary Reason for Admission: onset of labor Prenatal Procedures: ultrasound Intrapartum Procedures: spontaneous vaginal delivery Postpartum Procedures: none Complications-Operative and Postpartum: 2nd degree perineal laceration HEMOGLOBIN  Date Value Ref Range Status  02/21/2014 13.1 12.0 - 15.0 g/dL Final   HCT  Date Value Ref Range Status  02/21/2014 37.7 36.0 - 46.0 % Final    Physical Exam:  General: alert, cooperative and appears stated age 21Lochia: appropriate Uterine Fundus: firm  Discharge Diagnoses: Term Pregnancy-delivered  Discharge Information: Date: 02/24/2014 Activity: pelvic rest Diet: routine Medications: Ibuprofen, Colace and Percocet Condition: improved Instructions: refer to practice specific booklet Discharge to: home   Newborn Data: Live born female  Birth Weight: 7 lb 8.1 oz (3405 g) APGAR: 9, 9  Home with mother.  Rachel Foster H. 02/24/2014, 12:38 PM

## 2014-02-24 NOTE — Progress Notes (Signed)
Post Partum Day 1 Subjective: no complaints, up ad lib, voiding and tolerating PO  Objective: Blood pressure 104/51, pulse 70, temperature 98 F (36.7 C), temperature source Oral, resp. rate 16, height 5\' 9"  (1.753 m), weight 78.472 kg (173 lb), last menstrual period 05/27/2013, SpO2 98 %, unknown if currently breastfeeding.  Physical Exam:  General: alert, cooperative and appears stated age Lochia: appropriate Uterine Fundus: firm   Recent Labs  02/21/14 1825  HGB 13.1  HCT 37.7    Assessment/Plan: Plan for discharge tomorrow  breastfeeding   LOS: 3 days   Rachel Foster H. 02/24/2014, 9:52 AM

## 2014-02-24 NOTE — Lactation Note (Signed)
This note was copied from the chart of Rachel Foster. Lactation Consultation Note  Mother having difficulty today latching baby.  Her nipples are tender, pink and breasts filling, dripping w/ colostrum. Prepumped to help evert nipple more. Attempted latching in football and cross cradle.  Baby would not sustain latch. Baby is having a hard time latching deep. Positioned mother to side lying position to relax mother and baby. Baby latched briefly but did not sustain latch and mother very sore. Attempted on other breast.   Introduced  #20NS.  Baby latched. Sucks and swallows observed.  NS filled w/ colostrum after feeding. Switched mother to other side and baby latched then without NS.  Sucks and swallows observed. Plan for mother is to attempt latching baby on deep without NS and try the easier breast for a few min and then the more difficult breast. Demonstrated that she can also hand express some colostrum/breastmilk into a spoon as an appetizer for baby to calm her before feeding. Mother will use #20 NS is she needs to for soreness or difficulty latching. Reviewed engorgement care and Mom encouraged to feed baby 8-12 times/24 hours and with feeding cues.    Patient Name: Rachel Foster ZOXWR'UToday's Date: 02/24/2014 Reason for consult: Follow-up assessment;Difficult latch   Maternal Data    Feeding Feeding Type: Breast Fed  LATCH Score/Interventions Latch: Repeated attempts needed to sustain latch, nipple held in mouth throughout feeding, stimulation needed to elicit sucking reflex.  Audible Swallowing: Spontaneous and intermittent  Type of Nipple: Everted at rest and after stimulation  Comfort (Breast/Nipple): Filling, red/small blisters or bruises, mild/mod discomfort  Problem noted: Mild/Moderate discomfort Interventions (Mild/moderate discomfort): Hand expression;Hand massage;Comfort gels;Pre-pump if needed  Hold (Positioning): Assistance needed to correctly position  infant at breast and maintain latch.  LATCH Score: 7  Lactation Tools Discussed/Used Tools: Nipple Shields Nipple shield size: 20   Consult Status Consult Status: Follow-up Date: 02/25/14 Follow-up type: In-patient    Dahlia ByesBerkelhammer, Lashante Fryberger [facility] 02/24/2014, 9:55 AM

## 2014-06-10 DIAGNOSIS — R928 Other abnormal and inconclusive findings on diagnostic imaging of breast: Secondary | ICD-10-CM | POA: Insufficient documentation

## 2014-08-12 ENCOUNTER — Encounter (HOSPITAL_COMMUNITY): Payer: Self-pay

## 2014-08-12 ENCOUNTER — Inpatient Hospital Stay (HOSPITAL_COMMUNITY)
Admission: EM | Admit: 2014-08-12 | Discharge: 2014-08-12 | Disposition: A | Discharge status: Home / Self Care | Payer: Medicaid Other | Source: Ambulatory Visit | Attending: Obstetrics & Gynecology | Admitting: Obstetrics & Gynecology

## 2014-08-12 DIAGNOSIS — N939 Abnormal uterine and vaginal bleeding, unspecified: Secondary | ICD-10-CM | POA: Diagnosis not present

## 2014-08-12 LAB — CBC
HEMATOCRIT: 37.1 % (ref 36.0–46.0)
HEMOGLOBIN: 12.3 g/dL (ref 12.0–15.0)
MCH: 29.7 pg (ref 26.0–34.0)
MCHC: 33.2 g/dL (ref 30.0–36.0)
MCV: 89.6 fL (ref 78.0–100.0)
Platelets: 179 10*3/uL (ref 150–400)
RBC: 4.14 MIL/uL (ref 3.87–5.11)
RDW: 14 % (ref 11.5–15.5)
WBC: 6.3 10*3/uL (ref 4.0–10.5)

## 2014-08-12 LAB — URINALYSIS, ROUTINE W REFLEX MICROSCOPIC
Bilirubin Urine: NEGATIVE
GLUCOSE, UA: NEGATIVE mg/dL
Hgb urine dipstick: NEGATIVE
Ketones, ur: NEGATIVE mg/dL
LEUKOCYTES UA: NEGATIVE
NITRITE: NEGATIVE
Protein, ur: NEGATIVE mg/dL
UROBILINOGEN UA: 1 mg/dL (ref 0.0–1.0)
pH: 6 (ref 5.0–8.0)

## 2014-08-12 LAB — POCT PREGNANCY, URINE: Preg Test, Ur: NEGATIVE

## 2014-08-12 NOTE — MAU Note (Signed)
Pt states she has been bleeding heavily x 19 days (changing a pad 3 times a day), states she has been really dizzy and tired.

## 2014-08-12 NOTE — Discharge Instructions (Signed)

## 2014-08-12 NOTE — MAU Provider Note (Signed)
History     CSN: [id number]  Arrival date and time: 08/12/14 4098   First Provider Initiated Contact with Patient 08/12/14 0103      No chief complaint on file.  HPI  Ms. Rachel Foster is a 29 y.o. G1P1001 who presents to MAU today with vaginal bleeding x 19 days. She states that bleeding is heavy and requiring 3 pads per day. She is taking Micronor while breastfeeding since the birth of her child in February. She states that after initial bleeding from delivery she had no bleeding until 07/25/14 which has continued until today. She states intermittent cramping associated with the bleeding, but no pain now. She denies LOC or fever.   OB History    Gravida Para Term Preterm AB TAB SAB Ectopic Multiple Living   1 1 1  0 0 0 0 0 0 1      Past Medical History  Diagnosis Date  . Medical history non-contributory   . H. pylori infection 2011    Past Surgical History  Procedure Laterality Date  . Breast cyst excision Left     History reviewed. No pertinent family history.  History  Substance Use Topics  . Smoking status: Never Smoker   . Smokeless tobacco: Not on file  . Alcohol Use: No    Allergies:  Allergies  Allergen Reactions  . Latex Rash    Prescriptions prior to admission  Medication Sig Dispense Refill Last Dose  . ibuprofen (ADVIL,MOTRIN) 600 MG tablet Take 1 tablet (600 mg total) by mouth every 6 (six) hours as needed. 90 tablet 0 Unknown at Unknown time  . oxyCODONE-acetaminophen (ROXICET) 5-325 MG per tablet Take 2 tablets by mouth every 4 (four) hours as needed. May take 1-2 tablets every 4-6 hours as needed for pain 30 tablet 0 Unknown at Unknown time  . Prenatal Vit-Fe Fumarate-FA (PRENATAL MULTIVITAMIN) TABS tablet Take 1 tablet by mouth daily.    Unknown at Unknown time    Review of Systems  Constitutional: Negative for fever and malaise/fatigue.  Gastrointestinal: Negative for abdominal pain.  Genitourinary:       + vaginal bleeding   Physical Exam    Blood pressure 102/65, pulse 89, temperature 98.2 F (36.8 C), temperature source Oral, resp. rate 28, height 5\' 9"  (1.753 m), weight 164 lb (74.39 kg), last menstrual period 07/25/2014, SpO2 100 %, unknown if currently breastfeeding.  Physical Exam  Nursing note and vitals reviewed. Constitutional: She is oriented to person, place, and time. She appears well-developed and well-nourished. No distress.  HENT:  Head: Normocephalic and atraumatic.  Cardiovascular: Normal rate.   Respiratory: Effort normal.  GI: Soft. She exhibits no distension and no mass. There is no tenderness. There is no rebound and no guarding.  Genitourinary: Uterus is not enlarged and not tender. Cervix exhibits no motion tenderness, no discharge and no friability. Right adnexum displays no mass and no tenderness. Left adnexum displays no mass and no tenderness. There is bleeding (scant light brown) in the vagina. No vaginal discharge found.  Neurological: She is alert and oriented to person, place, and time.  Skin: Skin is warm and dry. No erythema.  Psychiatric: She has a normal mood and affect.    Results for orders placed or performed during the hospital encounter of 08/12/14 (from the past 24 hour(s))  Urinalysis, Routine w reflex microscopic (not at [facility])     Status: Abnormal   Collection Time: 08/12/14 12:50 AM  Result Value Ref Range   Color, Urine  YELLOW YELLOW   APPearance CLEAR CLEAR   Specific Gravity, Urine >1.030 (H) 1.005 - 1.030   pH 6.0 5.0 - 8.0   Glucose, UA NEGATIVE NEGATIVE mg/dL   Hgb urine dipstick NEGATIVE NEGATIVE   Bilirubin Urine NEGATIVE NEGATIVE   Ketones, ur NEGATIVE NEGATIVE mg/dL   Protein, ur NEGATIVE NEGATIVE mg/dL   Urobilinogen, UA 1.0 0.0 - 1.0 mg/dL   Nitrite NEGATIVE NEGATIVE   Leukocytes, UA NEGATIVE NEGATIVE  Pregnancy, urine POC     Status: None   Collection Time: 08/12/14 12:58 AM  Result Value Ref Range   Preg Test, Ur NEGATIVE NEGATIVE  CBC     Status: None    Collection Time: 08/12/14  1:11 AM  Result Value Ref Range   WBC 6.3 4.0 - 10.5 K/uL   RBC 4.14 3.87 - 5.11 MIL/uL   Hemoglobin 12.3 12.0 - 15.0 g/dL   HCT 91.4 78.2 - 95.6 %   MCV 89.6 78.0 - 100.0 fL   MCH 29.7 26.0 - 34.0 pg   MCHC 33.2 30.0 - 36.0 g/dL   RDW 21.3 08.6 - 57.8 %   Platelets 179 150 - 400 K/uL    MAU Course  Procedures None  MDM UPT- negative UA, CBC today Patient is hemodynamically stable Orthostatic VS Lying: 102/63 Sitting: 117/68 Standing: 102/65  Discussed with Dr. Mora Appl. Agrees with plan for discharge at this time. Patient should make an appointment to follow-up in the office for [place] and further management.  Assessment and Plan  A: Abnormal uterine bleeding  P: Discharge home Bleeding precautions discussed Patient advised to follow-up with [facility] for further evaluation and management of AUB Patient may return to MAU as needed or if her condition were to change or worsen  Marny Lowenstein, PA-C  08/12/2014, 1:27 AM

## 2014-10-05 ENCOUNTER — Encounter (HOSPITAL_COMMUNITY): Payer: Self-pay | Admitting: Cardiology

## 2014-10-05 ENCOUNTER — Emergency Department (HOSPITAL_COMMUNITY)
Admission: EM | Admit: 2014-10-05 | Discharge: 2014-10-05 | Disposition: A | Discharge status: Home / Self Care | Payer: Medicaid Other | Attending: Emergency Medicine | Admitting: Emergency Medicine

## 2014-10-05 DIAGNOSIS — Y9289 Other specified places as the place of occurrence of the external cause: Secondary | ICD-10-CM | POA: Diagnosis not present

## 2014-10-05 DIAGNOSIS — Y9389 Activity, other specified: Secondary | ICD-10-CM | POA: Insufficient documentation

## 2014-10-05 DIAGNOSIS — Z79899 Other long term (current) drug therapy: Secondary | ICD-10-CM | POA: Insufficient documentation

## 2014-10-05 DIAGNOSIS — X58XXXA Exposure to other specified factors, initial encounter: Secondary | ICD-10-CM | POA: Diagnosis not present

## 2014-10-05 DIAGNOSIS — Y998 Other external cause status: Secondary | ICD-10-CM | POA: Diagnosis not present

## 2014-10-05 DIAGNOSIS — S3992XA Unspecified injury of lower back, initial encounter: Secondary | ICD-10-CM | POA: Diagnosis present

## 2014-10-05 DIAGNOSIS — S39012A Strain of muscle, fascia and tendon of lower back, initial encounter: Secondary | ICD-10-CM | POA: Diagnosis not present

## 2014-10-05 DIAGNOSIS — Z91040 Latex allergy status: Secondary | ICD-10-CM | POA: Diagnosis not present

## 2014-10-05 MED ORDER — IBUPROFEN 800 MG PO TABS: 800.0000 mg | ORAL_TABLET | Freq: Three times a day (TID) | ORAL | Status: DC | PRN

## 2014-10-05 MED ORDER — METHOCARBAMOL 500 MG PO TABS: 500.0000 mg | ORAL_TABLET | Freq: Four times a day (QID) | ORAL | Status: DC | PRN

## 2014-10-05 MED ORDER — KETOROLAC TROMETHAMINE 60 MG/2ML IM SOLN
60.0000 mg | Freq: Once | INTRAMUSCULAR | Status: AC
  Administered 2014-10-05: 60 mg via INTRAMUSCULAR
  Filled 2014-10-05: qty 2

## 2014-10-05 NOTE — ED Notes (Signed)
Pt reports lower back pain that started this weekend. Reports the pain became worse this morning when she tried to pick up her brief case.

## 2014-10-05 NOTE — Discharge Instructions (Signed)
Read the information below.  Use the prescribed medication as directed.  Please discuss all new medications with your pharmacist.  You may return to the Emergency Department at any time for worsening condition or any new symptoms that concern you.  If there is any possibility that you might be pregnant or if you are breastfeeding, please let your health care provider know and discuss this with the pharmacist to ensure medication safety.     If you develop fevers, loss of control of bowel or bladder, weakness or numbness in your legs, or are unable to walk, return to the ER for a recheck.  ° ° °Back Pain, Adult °Low back pain is very common. About 1 in 5 people have back pain. The cause of low back pain is rarely dangerous. The pain often gets better over time. About half of people with a sudden onset of back pain feel better in just 2 weeks. About 8 in 10 people feel better by 6 weeks.  °CAUSES °Some common causes of back pain include: °· Strain of the muscles or ligaments supporting the spine. °· Wear and tear (degeneration) of the spinal discs. °· Arthritis. °· Direct injury to the back. °DIAGNOSIS °Most of the time, the direct cause of low back pain is not known. However, back pain can be treated effectively even when the exact cause of the pain is unknown. Answering your caregiver's questions about your overall health and symptoms is one of the most accurate ways to make sure the cause of your pain is not dangerous. If your caregiver needs more information, he or she may order lab work or imaging tests (X-rays or MRIs). However, even if imaging tests show changes in your back, this usually does not require surgery. °HOME CARE INSTRUCTIONS °For many people, back pain returns. Since low back pain is rarely dangerous, it is often a condition that people can learn to manage on their own.  °· Remain active. It is stressful on the back to sit or stand in one place. Do not sit, drive, or stand in one place for more  than 30 minutes at a time. Take short walks on level surfaces as soon as pain allows. Try to increase the length of time you walk each day. °· Do not stay in bed. Resting more than 1 or 2 days can delay your recovery. °· Do not avoid exercise or work. Your body is made to move. It is not dangerous to be active, even though your back may hurt. Your back will likely heal faster if you return to being active before your pain is gone. °· Pay attention to your body when you  bend and lift. Many people have less discomfort when lifting if they bend their knees, keep the load close to their bodies, and avoid twisting. Often, the most comfortable positions are those that put less stress on your recovering back. °· Find a comfortable position to sleep. Use a firm mattress and lie on your side with your knees slightly bent. If you lie on your back, put a pillow under your knees. °· Only take over-the-counter or prescription medicines as directed by your caregiver. Over-the-counter medicines to reduce pain and inflammation are often the most helpful. Your caregiver may prescribe muscle relaxant drugs. These medicines help dull your pain so you can more quickly return to your normal activities and healthy exercise. °· Put ice on the injured area. °¨ Put ice in a plastic bag. °¨ Place a towel between your skin and the bag. °¨ Leave the ice on for 15-20 minutes, 03-04 times a day for   the first 2 to 3 days. After that, ice and heat may be alternated to reduce pain and spasms. °· Ask your caregiver about trying back exercises and gentle massage. This may be of some benefit. °· Avoid feeling anxious or stressed. Stress increases muscle tension and can worsen back pain. It is important to recognize when you are anxious or stressed and learn ways to manage it. Exercise is a great option. °SEEK MEDICAL CARE IF: °· You have pain that is not relieved with rest or medicine. °· You have pain that does not improve in 1 week. °· You have new  symptoms. °· You are generally not feeling well. °SEEK IMMEDIATE MEDICAL CARE IF:  °· You have pain that radiates from your back into your legs. °· You develop new bowel or bladder control problems. °· You have unusual weakness or numbness in your arms or legs. °· You develop nausea or vomiting. °· You develop abdominal pain. °· You feel faint. °Document Released: 12/26/2004 Document Revised: 06/27/2011 Document Reviewed: 04/29/2013 °[employer]® Patient Information ©2015 [employer]. This information is not intended to replace advice given to you by your health care provider. Make sure you discuss any questions you have with your health care provider. ° °

## 2014-10-05 NOTE — ED Provider Notes (Signed)
CSN: [id number]     Arrival date & time 10/05/14  1118 History  This chart was scribed for non-physician practitioner, Trixie Dredge, PA-C, working with Elwin Mocha, MD by Marica Otter, ED Scribe. This patient was seen in room TR09C/TR09C and the patient's care was started at 2:56 PM.   Chief Complaint  Patient presents with  . Back Pain   The history is provided by the patient. No language interpreter was used.   PCP: Maryelizabeth Rowan, MD HPI Comments: Rachel Foster is a 29 y.o. female, with PMHx noted below, who presents to the Emergency Department complaining of atraumatic, 8/10, aching lower back pain onset 2 days ago and worsening today after pt attempted to pick up her briefcase. Pt denies any specific injury, however, notes she was moving this past weekend but denies lifting anything heavy. Pt denies taking any measures at home to alleviate her Sx. Pt denies abd pain, numbness/weakness of LE, tingling, fever, urinary or fecal incontinence, or any urinary Sx. Pt reports she drove herself to the ED. Pt denies any possibility of pregnancy.   Past Medical History  Diagnosis Date  . Medical history non-contributory   . H. pylori infection 2011   Past Surgical History  Procedure Laterality Date  . Breast cyst excision Left    History reviewed. No pertinent family history. Social History  Substance Use Topics  . Smoking status: Never Smoker   . Smokeless tobacco: None  . Alcohol Use: No   OB History    Gravida Para Term Preterm AB TAB SAB Ectopic Multiple Living   0 0 0 0 0 0 1     Review of Systems  Constitutional: Negative for fever.  Gastrointestinal: Negative for nausea, vomiting, abdominal pain and diarrhea.  Genitourinary: Negative for dysuria, urgency, frequency, vaginal bleeding and vaginal discharge.  Musculoskeletal: Positive for back pain (lower back pain). Negative for gait problem and neck stiffness.  Skin: Negative for rash.  Allergic/Immunologic: Negative  for immunocompromised state.  Neurological: Negative for weakness and numbness.  Psychiatric/Behavioral: Negative for self-injury.   Allergies  Latex  Home Medications   Prior to Admission medications   Medication Sig Start Date End Date Taking? Authorizing Provider  ibuprofen (ADVIL,MOTRIN) 600 MG tablet Take 1 tablet (600 mg total) by mouth every 6 (six) hours as needed. 02/24/14   Waynard Reeds, MD  oxyCODONE-acetaminophen (ROXICET) 5-325 MG per tablet Take 2 tablets by mouth every 4 (four) hours as needed. May take 1-2 tablets every 4-6 hours as needed for pain 02/24/14   Waynard Reeds, MD  Prenatal Vit-Fe Fumarate-FA (PRENATAL MULTIVITAMIN) TABS tablet Take 1 tablet by mouth daily.     Historical Provider, MD   Triage Vitals: BP 134/121 mmHg  Pulse 86  Temp(Src) 98.1 F (36.7 C) (Oral)  Resp 20  SpO2 100%  LMP 10/04/2014 Physical Exam  Constitutional: She appears well-developed and well-nourished. No distress.  HENT:  Head: Normocephalic and atraumatic.  Neck: Neck supple.  Pulmonary/Chest: Effort normal.  Abdominal: Soft. She exhibits no distension and no mass. There is no tenderness. There is no rebound and no guarding.  Musculoskeletal:  Spine nontender, no crepitus, or stepoffs. Lower extremities:  Strength 5/5, sensation intact, distal pulses intact.    Bilateral muscular tenderness to lower back, no skin changes.   Neurological: She is alert.  Skin: She is not diaphoretic.  Nursing note and vitals reviewed.  ED Course  Procedures (including critical care time) DIAGNOSTIC STUDIES: Oxygen Saturation is 100% on ra,  nl by my interpretation.    COORDINATION OF CARE: 2:58 PM: Discussed treatment plan which includes work note, meds, f/u with PCP with pt at bedside; patient verbalizes understanding and agrees with treatment plan.  MDM   Final diagnoses:  Low back strain, initial encounter    Afebrile, nontoxic patient with mechanical low back pain. No red flags.  D/C  home with ibuprofen, robaxin.  PCP follow up.  Discussed result, findings, treatment, and follow up  with patient.  Pt given return precautions.  Pt verbalizes understanding and agrees with plan.        I personally performed the services described in this documentation, which was scribed in my presence. The recorded information has been reviewed and is accurate.    Trixie Dredge, PA-C 10/05/14 1638  Elwin Mocha, MD 10/06/14 [phone]

## 2014-10-05 NOTE — ED Notes (Signed)
Re-attempted to locate pt in both sub-waiting room and main waiting room. Unsuccessful. Nurse first informed of situation.

## 2014-10-05 NOTE — ED Notes (Signed)
Attempted to locate pt in sub-waiting and waiting room.. Will try again in a few minutes.

## 2014-10-05 NOTE — ED Notes (Signed)
Declined W/C at D/C and was escorted to lobby by RN. 

## 2015-05-28 ENCOUNTER — Ambulatory Visit (INDEPENDENT_AMBULATORY_CARE_PROVIDER_SITE_OTHER): Payer: PRIVATE HEALTH INSURANCE | Admitting: Family Medicine

## 2015-05-28 VITALS — BP 102/66 | HR 84 | Temp 98.5°F | Resp 18 | Ht 69.0 in | Wt 154.0 lb

## 2015-05-28 DIAGNOSIS — J209 Acute bronchitis, unspecified: Secondary | ICD-10-CM | POA: Diagnosis not present

## 2015-05-28 DIAGNOSIS — J0100 Acute maxillary sinusitis, unspecified: Secondary | ICD-10-CM

## 2015-05-28 MED ORDER — AMOXICILLIN-POT CLAVULANATE 875-125 MG PO TABS: 1.0000 | ORAL_TABLET | Freq: Two times a day (BID) | ORAL | Status: DC

## 2015-05-28 NOTE — Progress Notes (Signed)
Patient ID: Rachel DolinChrissy M Foster MRN: [id number], DOB: 04-09-1985, 30 y.o. Date of Encounter: 05/28/2015, 1:45 PM  Primary Physician: Maryelizabeth RowanEWEY,[name], MD  Chief Complaint:  Chief Complaint  Patient presents with  . Cough    BODYACHES  . Chills  . Sinusitis    HPI: 30 y.o. year old female presents with 10 day history of nasal congestion, post nasal drip, sore throat, sinus pressure, and cough. Afebrile. No chills. Nasal congestion thick and green/yellow. Sinus pressure is the worst symptom. Cough is productive secondary to post nasal drip and not associated with time of day. Ears feel full, leading to sensation of muffled hearing. Has tried OTC cold preps without success. No GI complaints.   No recent antibiotics, recent travels, vomiting, but daughter just came off antibiotics  No leg trauma, sedentary periods, h/o cancer, or tobacco use.  Past Medical History  Diagnosis Date  . Medical history non-contributory   . H. pylori infection 2011     Home Meds: Prior to Admission medications   Medication Sig Start Date End Date Taking? Authorizing Provider  amoxicillin-clavulanate (AUGMENTIN) 875-125 MG tablet Take 1 tablet by mouth 2 (two) times daily. 05/28/15   Elvina SidleKurt Varick Keys, MD  ibuprofen (ADVIL,MOTRIN) 800 MG tablet Take 1 tablet (800 mg total) by mouth every 8 (eight) hours as needed for mild pain or moderate pain. Patient not taking: Reported on 05/28/2015 10/05/14   Trixie DredgeEmily West, PA-C  methocarbamol (ROBAXIN) 500 MG tablet Take 1-2 tablets (500-1,000 mg total) by mouth every 6 (six) hours as needed for muscle spasms (and pain). Patient not taking: Reported on 05/28/2015 10/05/14   Trixie DredgeEmily West, PA-C  Prenatal Vit-Fe Fumarate-FA (PRENATAL MULTIVITAMIN) TABS tablet Take 1 tablet by mouth daily. Reported on 05/28/2015    Historical Provider, MD    Allergies:  Allergies  Allergen Reactions  . Latex Rash    Social History   Social History  . Marital Status: Single    Spouse Name:  N/A  . Number of Children: N/A  . Years of Education: N/A   Occupational History  . Not on file.   Social History Main Topics  . Smoking status: Never Smoker   . Smokeless tobacco: Not on file  . Alcohol Use: No  . Drug Use: No  . Sexual Activity: Yes    Birth Control/ Protection: Pill   Other Topics Concern  . Not on file   Social History Narrative     Review of Systems: Constitutional: negative for chills, fever, night sweats or weight changes Cardiovascular: negative for chest pain or palpitations Respiratory: negative for hemoptysis, wheezing, or shortness of breath Abdominal: negative for abdominal pain, nausea, vomiting or diarrhea Dermatological: negative for rash Neurologic: negative for headache   Physical Exam: Blood pressure 102/66, pulse 84, temperature 98.5 F (36.9 C), temperature source Oral, resp. rate 18, height 5\' 9"  (1.753 m), weight 154 lb (69.854 kg), last menstrual period 05/22/2015, SpO2 100 %, unknown if currently breastfeeding., Body mass index is 22.73 kg/(m^2). General: Well developed, well nourished, in no acute distress. Head: Normocephalic, atraumatic, eyes without discharge, sclera non-icteric, nares are congested. Bilateral auditory canals clear, TM's are without perforation, pearly grey with reflective cone of light bilaterally. Serous effusion bilaterally behind TM's. Maxillary sinus TTP. Oral cavity moist, dentition normal. Posterior pharynx with post nasal drip and mild erythema. No peritonsillar abscess or tonsillar exudate. Neck: Supple. No thyromegaly. Full ROM. No lymphadenopathy. Lungs: Clear bilaterally to auscultation without wheezes, rales, or rhonchi. Breathing is unlabored.  Heart: RRR with S1 S2. No murmurs, rubs, or gallops appreciated. Msk:  Strength and tone normal for age. Extremities: No clubbing or cyanosis. No edema. Neuro: Alert and oriented X 3. Moves all extremities spontaneously. CNII-XII grossly in tact. Psych:   Responds to questions appropriately with a normal affect.    ASSESSMENT AND PLAN:  30 y.o. year old female with sinusitis -   ICD-9-CM ICD-10-CM   1. Acute maxillary sinusitis, recurrence not specified 461.0 J01.00 amoxicillin-clavulanate (AUGMENTIN) 875-125 MG tablet  2. Acute bronchitis, unspecified organism 466.0 J20.9 amoxicillin-clavulanate (AUGMENTIN) 875-125 MG tablet    -Tylenol/Motrin prn -Rest/fluids -RTC precautions -RTC 3-5 days if no improvement  Signed, Elvina Sidle, MD 05/28/2015 1:45 PM

## 2015-05-28 NOTE — Patient Instructions (Addendum)

## 2015-06-01 ENCOUNTER — Other Ambulatory Visit: Payer: Self-pay | Admitting: Obstetrics & Gynecology

## 2015-06-14 ENCOUNTER — Other Ambulatory Visit: Payer: Self-pay | Admitting: Obstetrics & Gynecology

## 2015-06-17 ENCOUNTER — Other Ambulatory Visit: Payer: Self-pay | Admitting: Obstetrics & Gynecology

## 2015-06-17 DIAGNOSIS — R928 Other abnormal and inconclusive findings on diagnostic imaging of breast: Secondary | ICD-10-CM

## 2015-06-17 LAB — CYTOLOGY - PAP

## 2015-06-23 ENCOUNTER — Ambulatory Visit
Admission: RE | Admit: 2015-06-23 | Discharge: 2015-06-23 | Disposition: A | Discharge status: Home / Self Care | Payer: PRIVATE HEALTH INSURANCE | Source: Ambulatory Visit | Attending: Obstetrics & Gynecology | Admitting: Obstetrics & Gynecology

## 2015-06-23 DIAGNOSIS — R928 Other abnormal and inconclusive findings on diagnostic imaging of breast: Secondary | ICD-10-CM

## 2015-10-20 IMAGING — US US OB COMP LESS 14 WK
1 series · 14 of 28 positions shown · non-contrast
Comparison: None.

CLINICAL DATA: Right lower quadrant pain, pregnant

EXAM:
OBSTETRIC <14 WK US AND TRANSVAGINAL OB US
TECHNIQUE: Both transabdominal and transvaginal ultrasound examinations were
performed for complete evaluation of the gestation as well as the
maternal uterus, adnexal regions, and pelvic cul-de-sac.
Transvaginal technique was performed to assess early pregnancy.

[Series 1: us ob comp less 14 wk · 0.18mm/px · 14 of 69 slices shown]
[im 3/69]
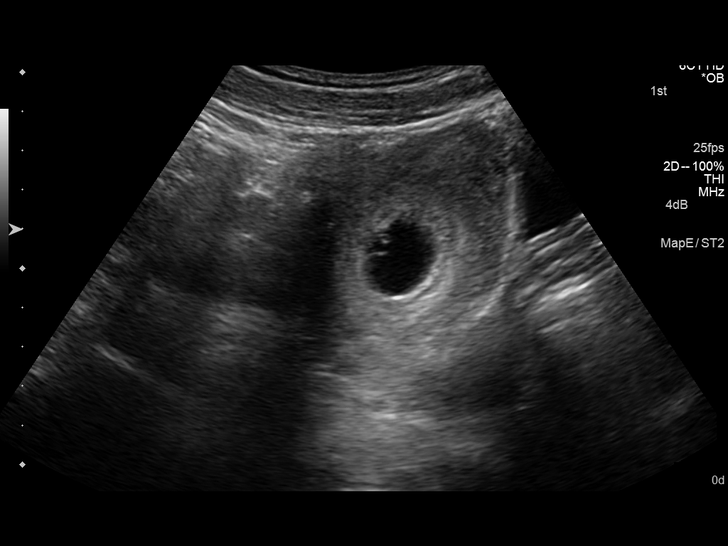
[im 8/69]
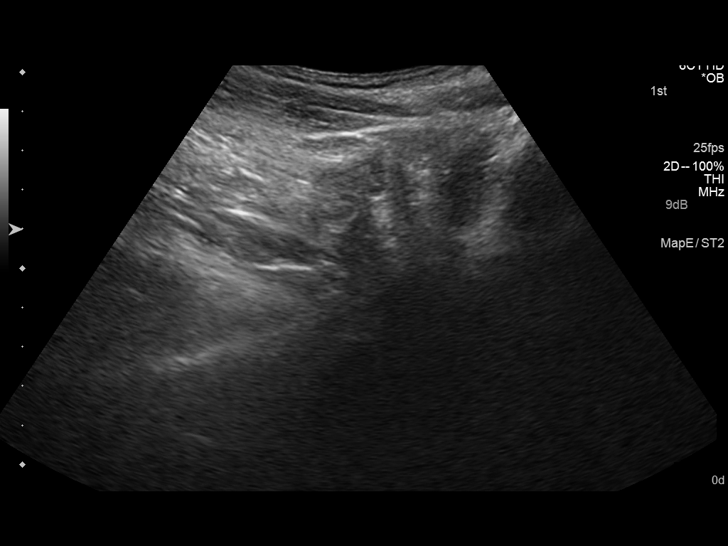
[im 13/69]
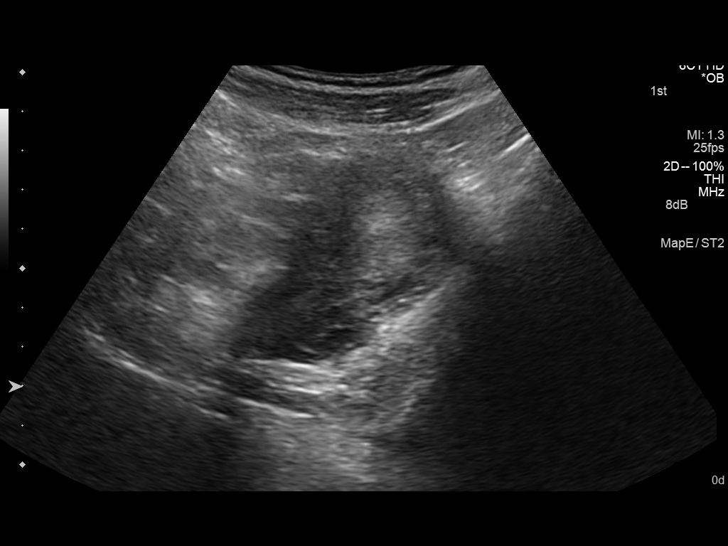
[im 18/69]
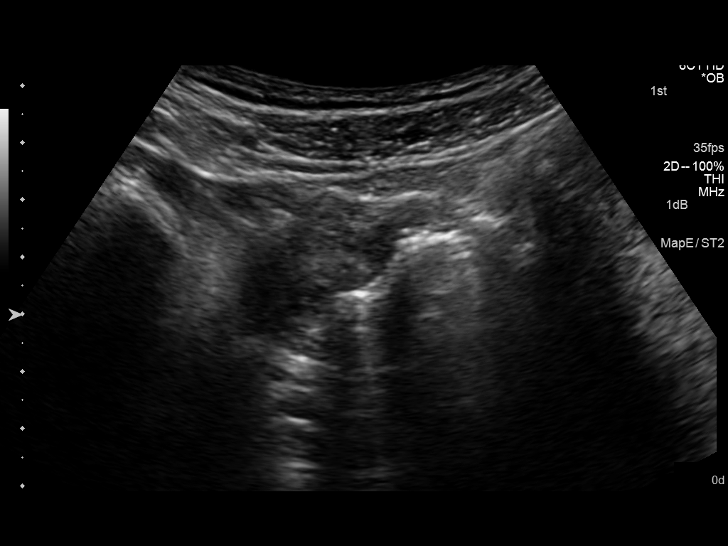
[im 23/69]
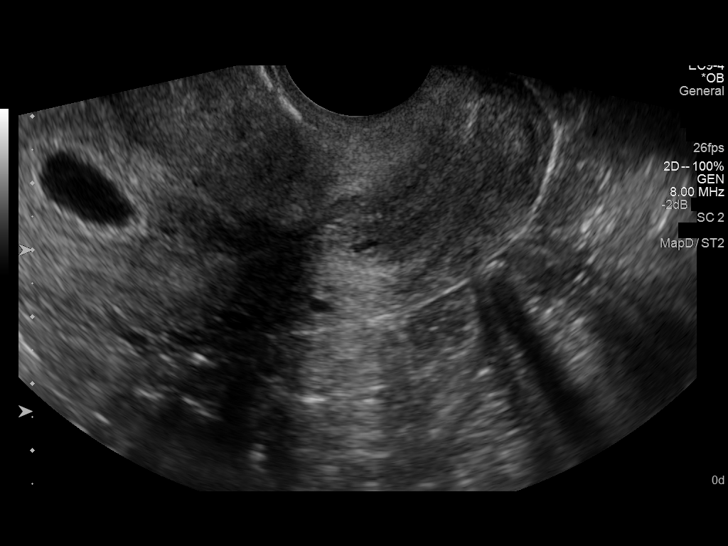
[im 28/69]
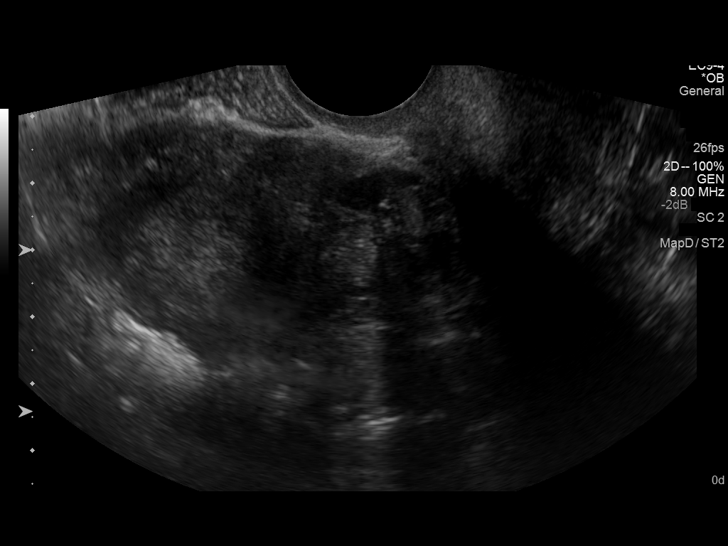
[im 33/69]
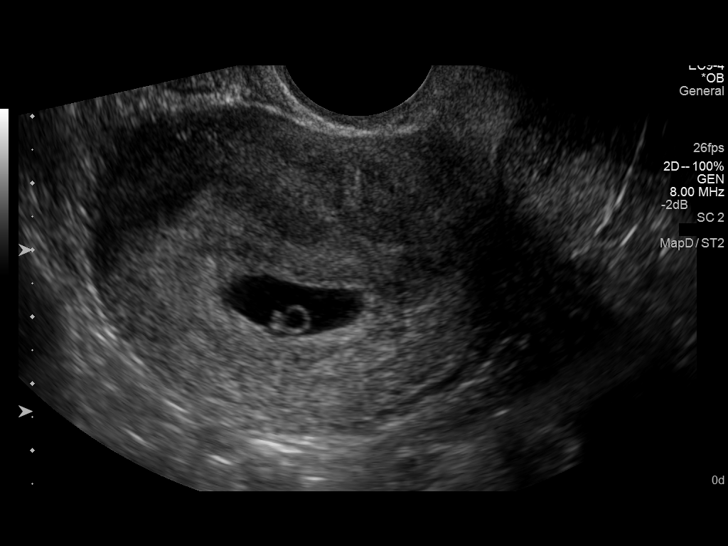
[im 38/69]
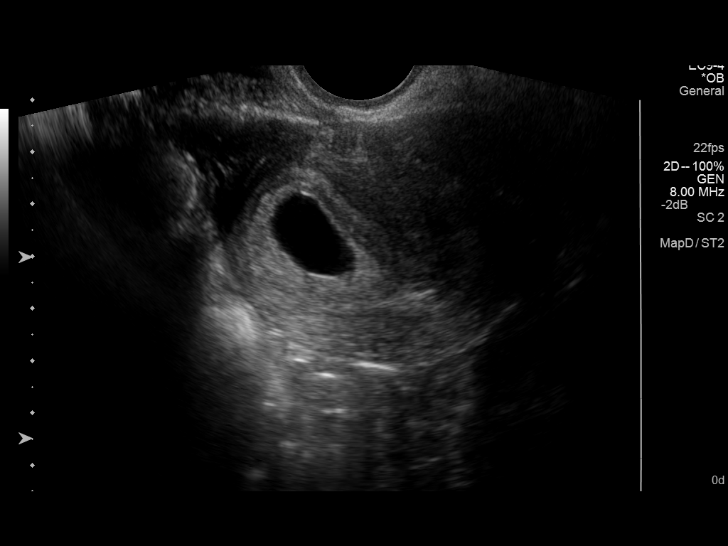
[im 43/69]
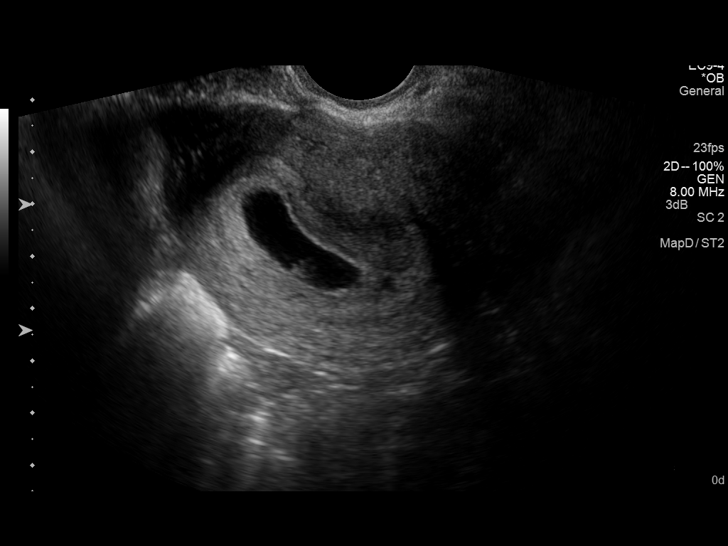
[im 48/69]
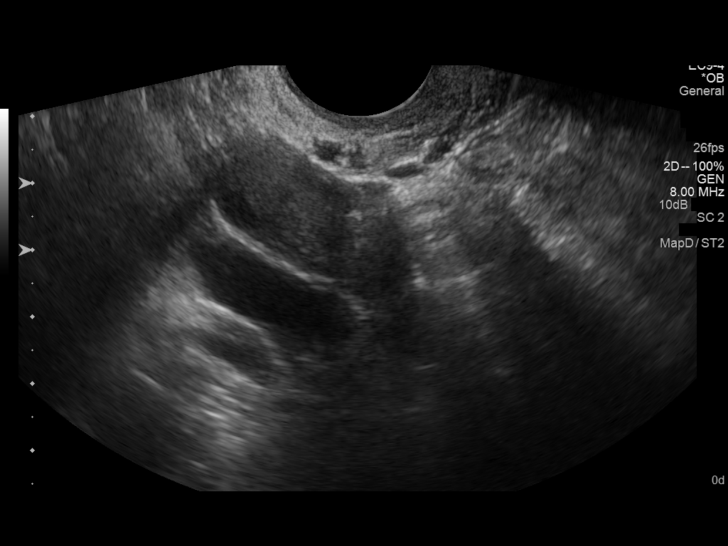
[im 53/69]
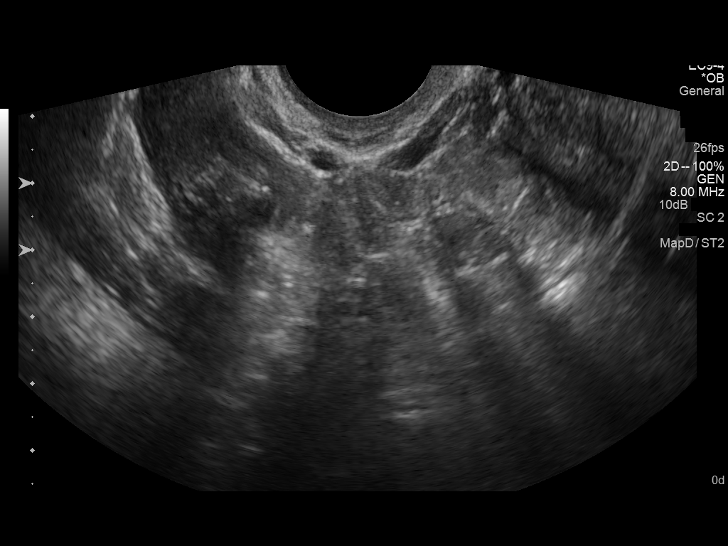
[im 58/69]
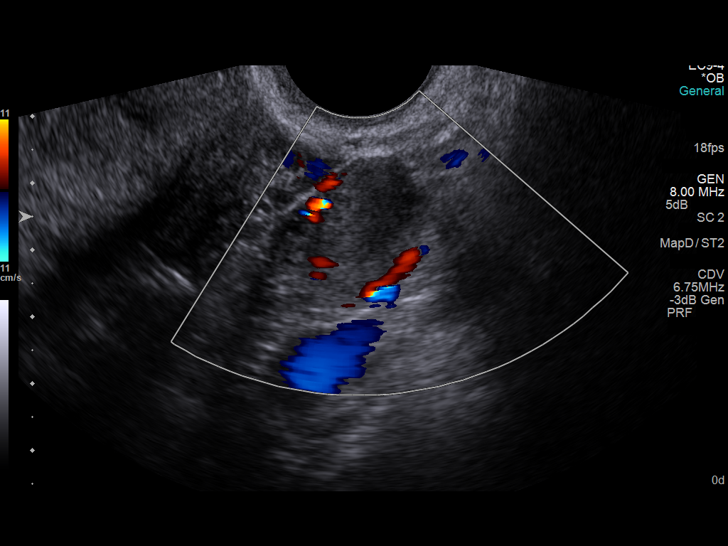
[im 63/69]
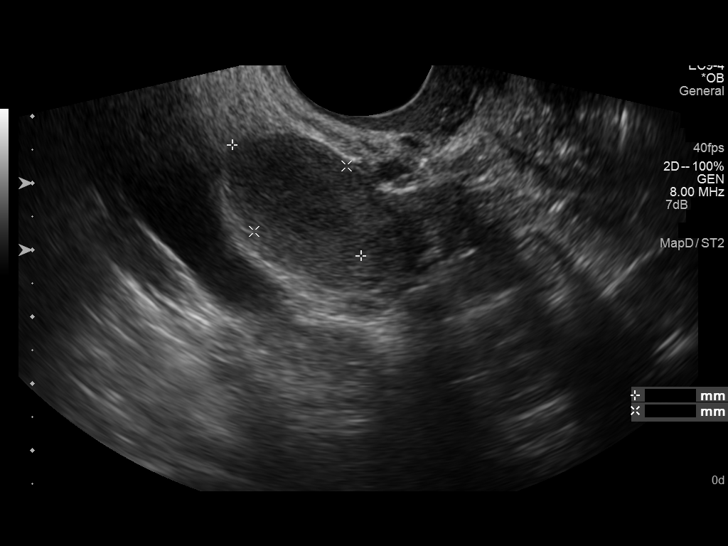
[im 69/69]
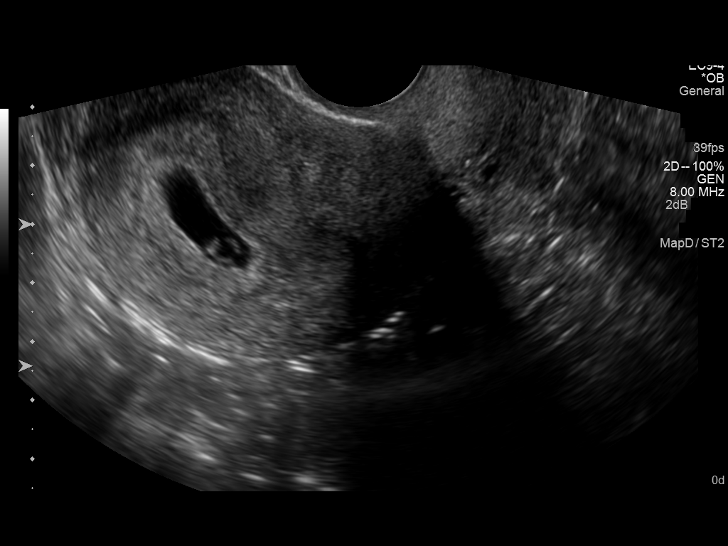

[14 of 28 positions shown; findings below may reference images not displayed]

FINDINGS: Intrauterine gestational sac: Visualized/normal in shape.

Yolk sac:  Present

Embryo:  Present

Cardiac Activity: Present

Heart Rate:  119 bpm

CRL:   3.3  mm   6 w 0 d                  US EDC: 03/03/2014

Maternal uterus/adnexae: No subchronic hemorrhage.

Right ovary is within normal limits, measuring 2.8 x 1.5 x 2.4 cm.

Left ovary measures 3.5 x 2.0 x 1.4 cm and is notable for a 2.5 x
1.7 x 2.0 cm corpus luteal cyst.

No free fluid.
IMPRESSION: Single live intrauterine gestation with estimated gestational age 6
weeks 0 days by crown-rump length.

## 2015-12-15 ENCOUNTER — Other Ambulatory Visit: Payer: Self-pay | Admitting: Obstetrics & Gynecology

## 2015-12-15 DIAGNOSIS — N6002 Solitary cyst of left breast: Secondary | ICD-10-CM

## 2016-02-13 IMAGING — CR DG FOREARM 2V*R*
2 series · 2 of 2 positions shown · non-contrast
Comparison: No priors.

CLINICAL DATA: 28-year-old female 23 weeks pregnant with history of
trauma after tripping on a toy and falling backwards onto the floor
complaining of pain in the right wrist and right thumb. Bruising on
the anterior side of the wrist and swelling in the right thumb.

EXAM:
RIGHT FOREARM - 2 VIEW

[x forearm ap right]
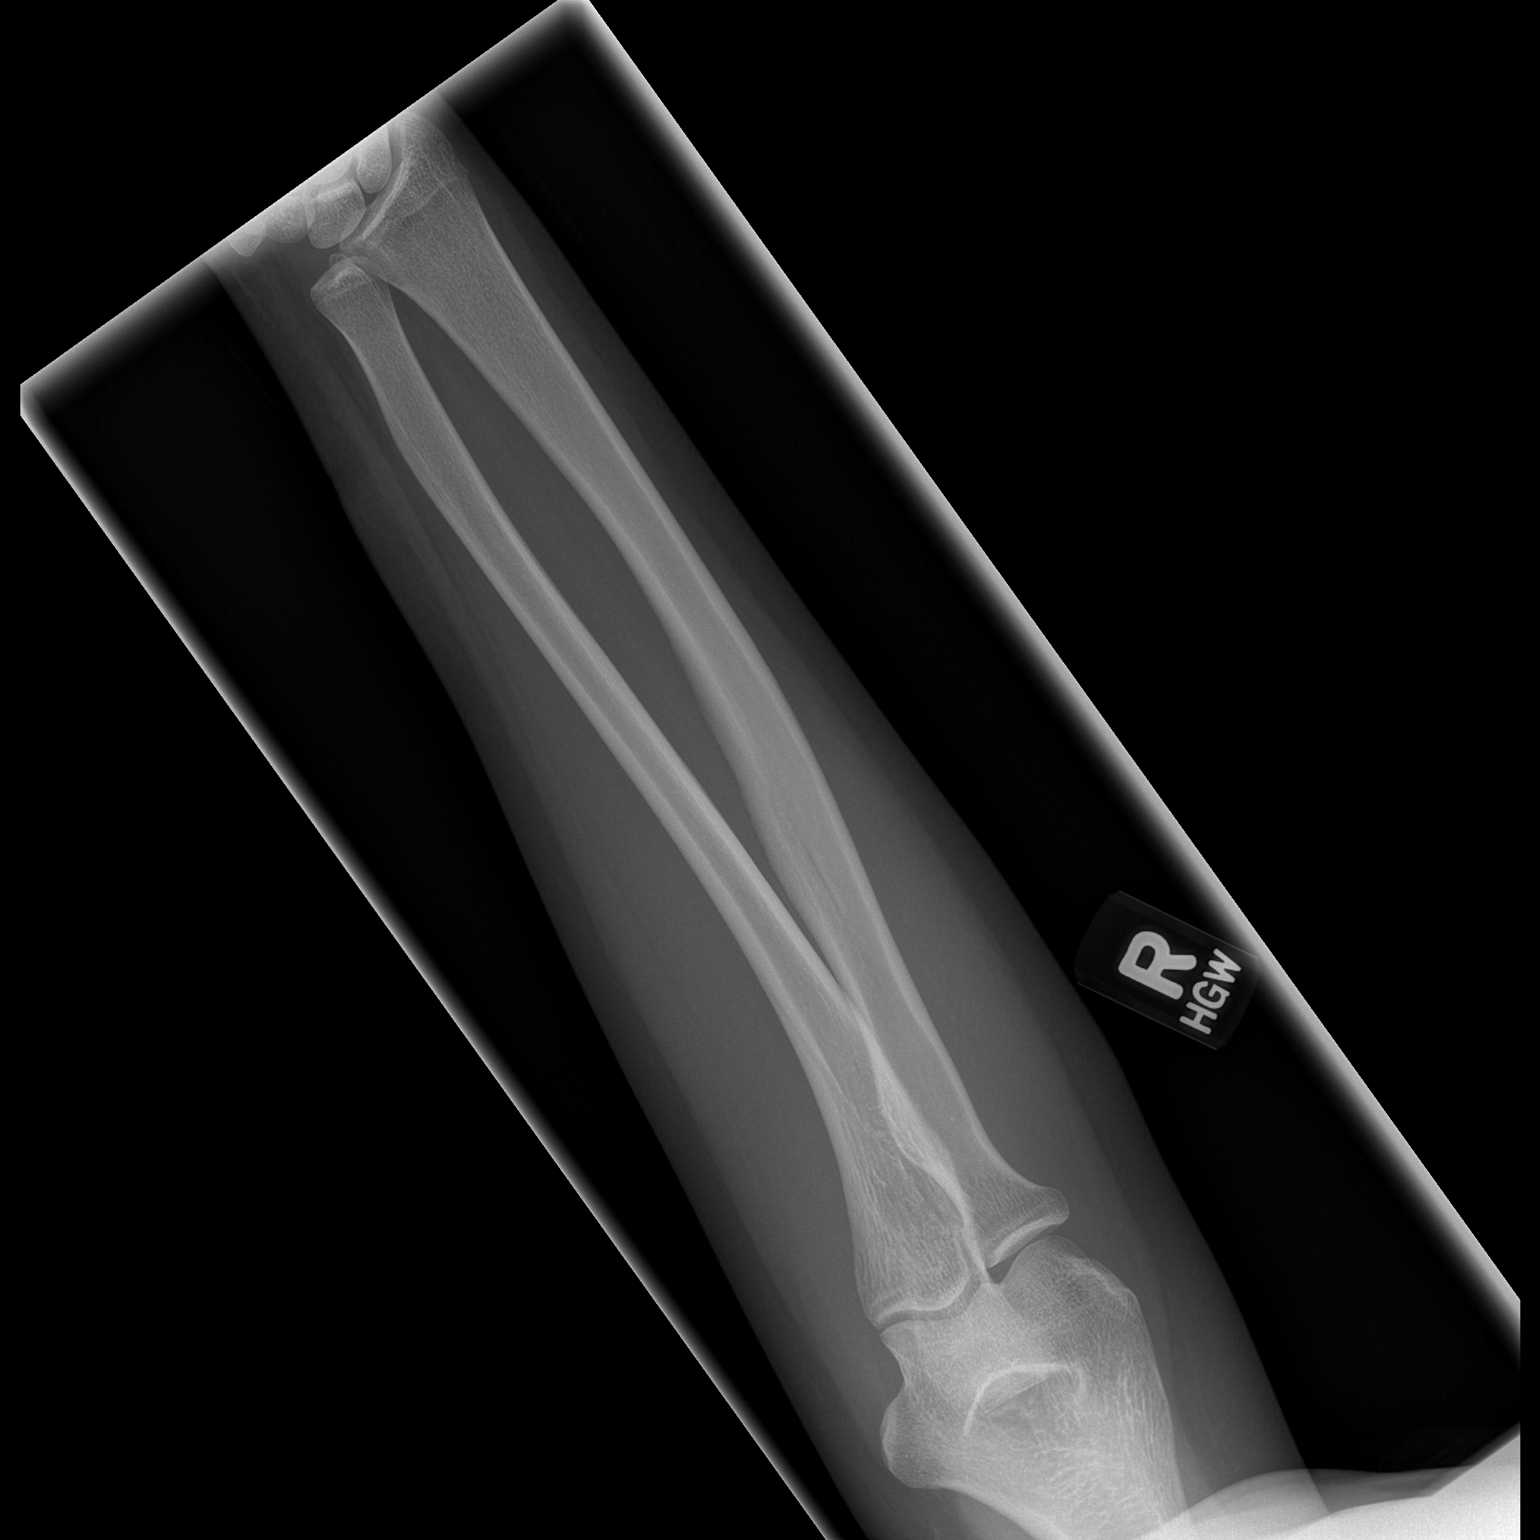

[x forearm lat right]
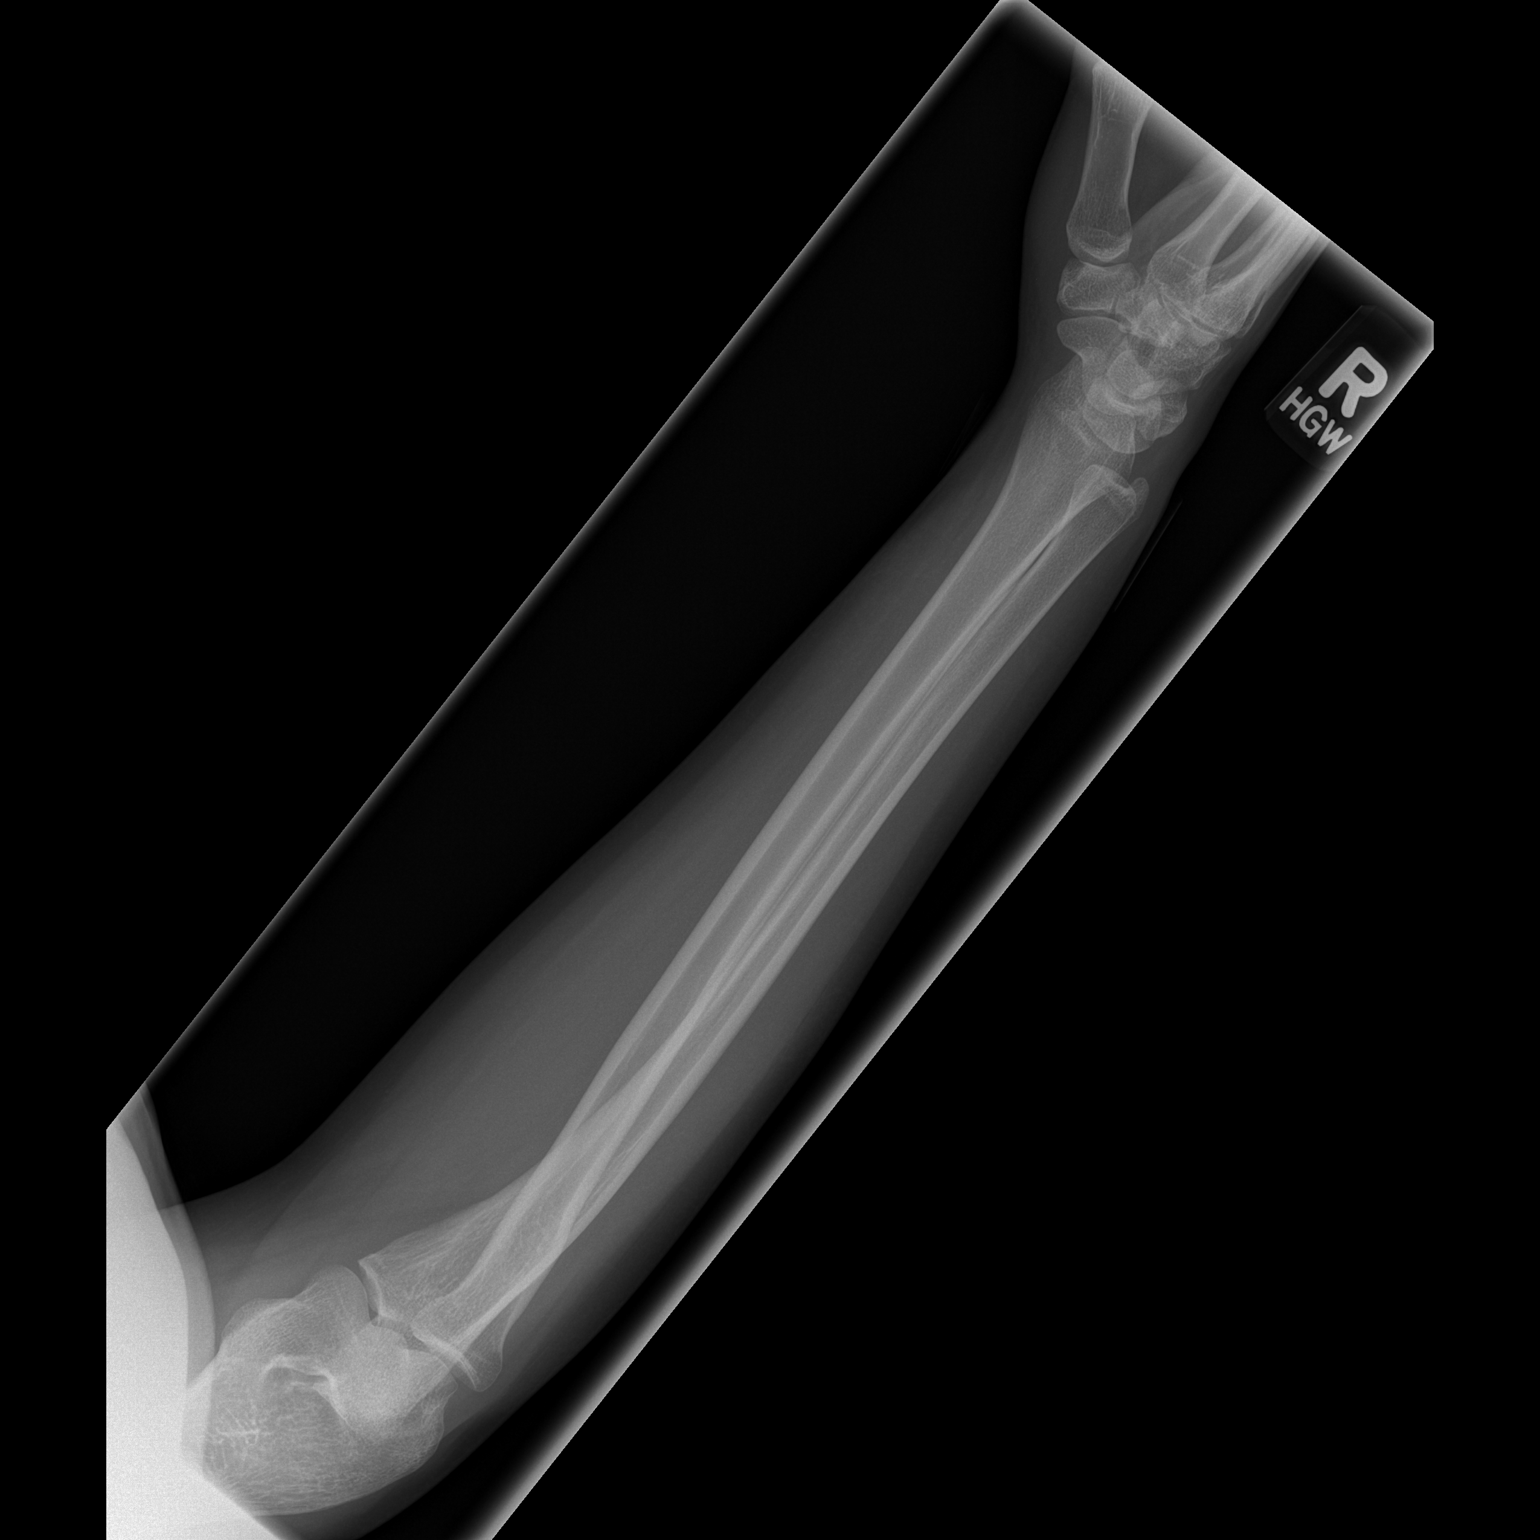

[2 of 2 positions shown; findings below may reference images not displayed]

FINDINGS: There is no evidence of fracture or other focal bone lesions. Soft
tissues are unremarkable.
IMPRESSION: Negative.

## 2017-06-27 ENCOUNTER — Other Ambulatory Visit: Payer: Self-pay

## 2017-06-27 ENCOUNTER — Emergency Department: Payer: 59

## 2017-06-27 ENCOUNTER — Emergency Department
Admission: EM | Admit: 2017-06-27 | Discharge: 2017-06-27 | Disposition: A | Discharge status: Home / Self Care | Payer: 59 | Attending: Emergency Medicine | Admitting: Emergency Medicine

## 2017-06-27 DIAGNOSIS — S99921A Unspecified injury of right foot, initial encounter: Secondary | ICD-10-CM | POA: Diagnosis present

## 2017-06-27 DIAGNOSIS — Y929 Unspecified place or not applicable: Secondary | ICD-10-CM | POA: Insufficient documentation

## 2017-06-27 DIAGNOSIS — Y939 Activity, unspecified: Secondary | ICD-10-CM | POA: Insufficient documentation

## 2017-06-27 DIAGNOSIS — S80212A Abrasion, left knee, initial encounter: Secondary | ICD-10-CM | POA: Diagnosis not present

## 2017-06-27 DIAGNOSIS — S9031XA Contusion of right foot, initial encounter: Secondary | ICD-10-CM | POA: Diagnosis not present

## 2017-06-27 DIAGNOSIS — Y999 Unspecified external cause status: Secondary | ICD-10-CM | POA: Diagnosis not present

## 2017-06-27 DIAGNOSIS — Z79899 Other long term (current) drug therapy: Secondary | ICD-10-CM | POA: Insufficient documentation

## 2017-06-27 DIAGNOSIS — S60511A Abrasion of right hand, initial encounter: Secondary | ICD-10-CM

## 2017-06-27 MED ORDER — MELOXICAM 15 MG PO TABS: 15.0000 mg | ORAL_TABLET | Freq: Every day | ORAL | 0 refills | Status: DC
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
## 2017-07-03 DIAGNOSIS — Z87898 Personal history of other specified conditions: Secondary | ICD-10-CM | POA: Insufficient documentation

## 2017-07-03 DIAGNOSIS — Z803 Family history of malignant neoplasm of breast: Secondary | ICD-10-CM | POA: Insufficient documentation

## 2017-07-27 ENCOUNTER — Ambulatory Visit
Admission: RE | Admit: 2017-07-27 | Discharge: 2017-07-27 | Disposition: A | Discharge status: Home / Self Care | Payer: 59 | Source: Ambulatory Visit | Attending: Family Medicine | Admitting: Family Medicine

## 2017-07-27 ENCOUNTER — Other Ambulatory Visit: Payer: Self-pay | Admitting: Family Medicine

## 2017-07-27 DIAGNOSIS — T1490XA Injury, unspecified, initial encounter: Secondary | ICD-10-CM

## 2017-07-27 DIAGNOSIS — R52 Pain, unspecified: Secondary | ICD-10-CM

## 2017-12-19 LAB — BASIC METABOLIC PANEL
BUN: 16 (ref 4–21)
CO2: 29 — AB (ref 13–22)
Chloride: 104 (ref 99–108)
Creatinine: 0.8 (ref 0.5–1.1)
Glucose: 92
Potassium: 4 (ref 3.4–5.3)
Sodium: 142 (ref 137–147)

## 2017-12-19 LAB — COMPREHENSIVE METABOLIC PANEL
Albumin: 4.6 (ref 3.5–5.0)
Calcium: 9.4 (ref 8.7–10.7)
GFR calc Af Amer: 120
GFR calc non Af Amer: 104

## 2017-12-19 LAB — HEPATIC FUNCTION PANEL
ALT: 15 (ref 7–35)
AST: 12 — AB (ref 13–35)
Alkaline Phosphatase: 49 (ref 25–125)
Bilirubin, Total: 0.7

## 2017-12-19 LAB — LIPID PANEL
Cholesterol: 156 (ref 0–200)
HDL: 54 (ref 35–70)
LDL Cholesterol: 88
LDl/HDL Ratio: 1.6
Triglycerides: 70 (ref 40–160)

## 2017-12-19 LAB — CBC AND DIFFERENTIAL
HCT: 41 (ref 36–46)
Hemoglobin: 13.4 (ref 12.0–16.0)
Neutrophils Absolute: 57.1
Platelets: 223 (ref 150–399)
WBC: 4.8

## 2017-12-19 LAB — CBC: RBC: 4.49 (ref 3.87–5.11)

## 2017-12-19 LAB — HM HIV SCREENING LAB: HM HIV Screening: NEGATIVE

## 2017-12-19 LAB — TSH: TSH: 2.24 (ref 0.41–5.90)

## 2019-05-20 LAB — HM PAP SMEAR: HM Pap smear: NEGATIVE

## 2019-09-12 ENCOUNTER — Other Ambulatory Visit: Payer: Self-pay | Admitting: Obstetrics and Gynecology

## 2019-09-12 DIAGNOSIS — R928 Other abnormal and inconclusive findings on diagnostic imaging of breast: Secondary | ICD-10-CM

## 2019-09-30 ENCOUNTER — Ambulatory Visit
Admission: RE | Admit: 2019-09-30 | Discharge: 2019-09-30 | Disposition: A | Discharge status: Home / Self Care | Payer: 59 | Source: Ambulatory Visit | Attending: Obstetrics and Gynecology | Admitting: Obstetrics and Gynecology

## 2019-09-30 ENCOUNTER — Other Ambulatory Visit: Payer: Self-pay

## 2019-09-30 DIAGNOSIS — R928 Other abnormal and inconclusive findings on diagnostic imaging of breast: Secondary | ICD-10-CM

## 2019-10-16 ENCOUNTER — Ambulatory Visit: Payer: 59 | Admitting: Family Medicine

## 2019-10-28 ENCOUNTER — Ambulatory Visit (INDEPENDENT_AMBULATORY_CARE_PROVIDER_SITE_OTHER): Payer: PRIVATE HEALTH INSURANCE | Admitting: Family Medicine

## 2019-10-28 ENCOUNTER — Encounter: Payer: Self-pay | Admitting: Family Medicine

## 2019-10-28 ENCOUNTER — Other Ambulatory Visit: Payer: Self-pay

## 2019-10-28 VITALS — BP 100/60 | HR 73 | Temp 98.0°F | Ht 68.5 in | Wt 160.2 lb

## 2019-10-28 DIAGNOSIS — M542 Cervicalgia: Secondary | ICD-10-CM | POA: Diagnosis not present

## 2019-10-28 DIAGNOSIS — M545 Low back pain, unspecified: Secondary | ICD-10-CM | POA: Diagnosis not present

## 2019-10-28 DIAGNOSIS — F633 Trichotillomania: Secondary | ICD-10-CM | POA: Diagnosis not present

## 2019-10-28 DIAGNOSIS — L309 Dermatitis, unspecified: Secondary | ICD-10-CM

## 2019-10-28 DIAGNOSIS — R6882 Decreased libido: Secondary | ICD-10-CM | POA: Insufficient documentation

## 2019-10-28 DIAGNOSIS — G8929 Other chronic pain: Secondary | ICD-10-CM

## 2019-10-28 MED ORDER — CYCLOBENZAPRINE HCL 5 MG PO TABS: 5.0000 mg | ORAL_TABLET | Freq: Three times a day (TID) | ORAL | 1 refills | Status: DC | PRN

## 2019-10-28 NOTE — Patient Instructions (Signed)
#  Referral I have placed a referral to a specialist for you. You should receive a phone call from the specialty office. Make sure your voicemail is not full and that if you are able to answer your phone to unknown or new numbers.   It may take up to 2 weeks to hear about the referral. If you do not hear anything in 2 weeks, please call our office and ask to speak with the referral coordinator.   

## 2019-10-28 NOTE — Assessment & Plan Note (Signed)
Pt notes remote history. No concern today, but does feel that it is flaring up again. Will continue to monitor

## 2019-10-28 NOTE — Progress Notes (Signed)
Subjective:     Rachel Foster is a 34 y.o. female presenting for Establish Care and Back Pain (chronic lower back pain x 1 year )     Back Pain This is a chronic problem. The current episode started more than 1 year ago. The problem occurs intermittently (will flare up for a week). The problem has been gradually worsening since onset. The pain is present in the lumbar spine. Quality: stiffness. Radiates to: to the neck. The symptoms are aggravated by standing and position. Associated symptoms include numbness (rare in the neck). Pertinent negatives include no bladder incontinence, bowel incontinence, fever, headaches, tingling or weakness. She has tried NSAIDs, bed rest and home exercises (lumbar support, pain patches) for the symptoms. The treatment provided mild relief.          Review of Systems  Constitutional: Negative for fever.  Gastrointestinal: Negative for bowel incontinence.  Genitourinary: Negative for bladder incontinence.  Musculoskeletal: Positive for back pain.  Neurological: Positive for numbness (rare in the neck). Negative for tingling, weakness and headaches.     Social History   Tobacco Use  Smoking Status Never Smoker        Objective:    BP Readings from Last 3 Encounters:  10/28/19 100/60  06/27/17 109/66  05/28/15 102/66   Wt Readings from Last 3 Encounters:  10/28/19 160 lb 4 oz (72.7 kg)  06/27/17 156 lb (70.8 kg)  05/28/15 154 lb (69.9 kg)    BP 100/60   Pulse 73   Temp 98 F (36.7 C) (Temporal)   Ht 5' 8.5" (1.74 m)   Wt 160 lb 4 oz (72.7 kg)   LMP 09/28/2019 (Exact Date)   SpO2 98%   BMI 24.01 kg/m    Physical Exam Constitutional:      General: She is not in acute distress.    Appearance: She is well-developed. She is not diaphoretic.  HENT:     Right Ear: External ear normal.     Left Ear: External ear normal.  Eyes:     Conjunctiva/sclera: Conjunctivae normal.  Cardiovascular:     Rate and Rhythm: Normal  rate.  Pulmonary:     Effort: Pulmonary effort is normal.  Musculoskeletal:     Cervical back: Normal range of motion and neck supple. No rigidity or tenderness.     Comments: Back:  Inspection: no abnormalities ROM: normal, though with pain with extension and right rotation Palpation: no spinous ttp or paraspinous ttp Strength: normal Straight leg raise negative  Skin:    General: Skin is warm and dry.     Capillary Refill: Capillary refill takes less than 2 seconds.  Neurological:     Mental Status: She is alert. Mental status is at baseline.  Psychiatric:        Mood and Affect: Mood normal.        Behavior: Behavior normal.           Assessment & Plan:   Problem List Items Addressed This Visit      Musculoskeletal and Integument   Eczema    On eye lids, encouraged lotion and trial of hydrocortisone if no improvement call and can do short course of triamcinolone        Other   Trichotillomania    Pt notes remote history. No concern today, but does feel that it is flaring up again. Will continue to monitor      Chronic bilateral low back pain without sciatica - Primary  Reassuring exam. 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 Advised course of PT. Cont NSAIDs prn. Cyclobenzaprine 5 mg prn for severe symptoms at night.       Relevant Medications   cyclobenzaprine (FLEXERIL) 5 MG tablet   Other Relevant Orders   Ambulatory referral to Physical Therapy   Chronic neck pain    Suspect related to posture. Trial of PT      Relevant Medications   cyclobenzaprine (FLEXERIL) 5 MG tablet   Other Relevant Orders   Ambulatory referral to Physical Therapy       Return in about 6 weeks (around 12/09/2019), or if symptoms worsen or fail to improve.  Lynnda Child, MD  This visit occurred during the SARS-CoV-2 public health emergency.  Safety protocols were in place, including screening questions prior to the visit, additional usage of staff PPE, and extensive  cleaning of exam room while observing appropriate contact time as indicated for disinfecting solutions.

## 2019-10-28 NOTE — Assessment & Plan Note (Signed)
On eye lids, encouraged lotion and trial of hydrocortisone if no improvement call and can do short course of triamcinolone

## 2019-10-28 NOTE — Assessment & Plan Note (Signed)
Reassuring exam. 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 Advised course of PT. Cont NSAIDs prn. Cyclobenzaprine 5 mg prn for severe symptoms at night.

## 2019-10-28 NOTE — Assessment & Plan Note (Signed)
Suspect related to posture. Trial of PT

## 2019-12-03 ENCOUNTER — Encounter: Payer: Self-pay | Admitting: Family Medicine

## 2020-01-16 ENCOUNTER — Encounter: Payer: Self-pay | Admitting: Family Medicine

## 2020-05-21 ENCOUNTER — Ambulatory Visit
Admission: EM | Admit: 2020-05-21 | Discharge: 2020-05-21 | Disposition: A | Discharge status: Home / Self Care | Payer: PRIVATE HEALTH INSURANCE

## 2020-05-21 ENCOUNTER — Other Ambulatory Visit: Payer: Self-pay

## 2020-05-21 ENCOUNTER — Telehealth: Payer: Self-pay

## 2020-05-21 DIAGNOSIS — H6692 Otitis media, unspecified, left ear: Secondary | ICD-10-CM | POA: Diagnosis not present

## 2020-05-21 MED ORDER — AZITHROMYCIN 250 MG PO TABS: 250.0000 mg | ORAL_TABLET | Freq: Every day | ORAL | 0 refills | Status: DC

## 2020-05-21 NOTE — Telephone Encounter (Signed)
[facility] Night - Client TELEPHONE ADVICE RECORD [employer] Patient Name: Rachel Foster Gender: Female DOB: [birth year] Age: 35 Y 6 M 20 D Return Phone Number: [phone] (Primary) Address: City/ State/ Zip: [place] [address] Client [facility] Night - Client Client Site [facility] - Night Physician Gweneth Dimitri- MD Contact Type Call Who Is Calling Patient / Member / Family / Caregiver Call Type Triage / Clinical Relationship To Patient Self Return Phone Number [phone] (Primary) Chief Complaint Hearing Loss Reason for Call Symptomatic / Request for Health Information Initial Comment Caller states having ear issue; Work MD gave abx and prednisone but can't hear with left ear; has discharge; Translation No Nurse Assessment Nurse: Loistine Simas, RN, Lannette Donath Date/Time (Eastern Time): 05/21/2020 8:46:21 AM Confirm and document reason for call. If symptomatic, describe symptoms. ---Caller states having ear issue. Work MD gave abx and prednisone x5 days, but can't hear with left ear. Has discharge, red/brownish. Denies fever or other symptoms. Does the patient have any new or worsening symptoms? ---Yes Will a triage be completed? ---Yes Related visit to physician within the last 2 weeks? ---No Does the PT have any chronic conditions? (i.e. diabetes, asthma, this includes High risk factors for pregnancy, etc.) ---No Is the patient pregnant or possibly pregnant? (Ask all females between the ages of 57-55) ---No Is this a behavioral health or substance abuse call? ---No Guidelines Guideline Title Affirmed Question Affirmed Notes Nurse Date/Time (Eastern Time) Ear - Otitis Media Follow-up Call [1] Taking antibiotic > 72 hours (3 days) and [2] pain persists or recurs Hearon, RN, Kacie 05/21/2020 8:48:11 AM Disp. Time Lamount Cohen Time) Disposition Final User 05/21/2020 8:50:14 AM See PCP within  24 Hours Yes Hearon, RN, Lannette Donath PLEASE NOTE: All timestamps contained within this report are represented as [place] Standard Time. CONFIDENTIALTY NOTICE: This fax transmission is intended only for the addressee. It contains information that is legally privileged, confidential or otherwise protected from use or disclosure. If you are not the intended recipient, you are strictly prohibited from reviewing, disclosing, copying using or disseminating any of this information or taking any action in reliance on or regarding this information. If you have received this fax in error, please notify us immediately by telephone so that we can arrange for its return to [place]. Phone: [phone], Toll-Free: [phone], Fax: [phone] Page: 2 of 2 Call Id: [id number] Caller Disagree/Comply Comply Caller Understands Yes PreDisposition Did not know what to do Care Advice Given Per Guideline SEE PCP WITHIN 24 HOURS: * IF OFFICE WILL BE OPEN: You need to be examined within the next 24 hours. Call your doctor (or NP/PA) when the office opens and make an appointment. CARE ADVICE given per Ear - Otitis Media Follow-Up Call (Adult) guideline * You become worse CALL BACK IF: PAIN MEDICINES: * For pain relief, you can take either acetaminophen, ibuprofen, or naproxen. Comments User: Maryan Puls, RN Date/Time Lamount Cohen Time): 05/21/2020 8:54:31 AM Scheduling stated they are booked for the day. Advised caller her next best option is UC. Caller verbalized understanding. Referrals REFERRED TO PCP OFFICE

## 2020-05-21 NOTE — Discharge Instructions (Signed)
Stop taking the amoxicillin.  Start taking the Zithromax.    Take Tylenol or ibuprofen as needed for discomfort.    Follow up with your primary care provider or an Ear, Nose, & Throat specialist if your symptoms are not improving.

## 2020-05-21 NOTE — ED Triage Notes (Signed)
Pt reports having L ear pain since last Saturday. sts she have been prescribed abx and steroid and still having the discomfort. sts it is hard to hear out of L ear. This morning she noticed some "reddish, brownish stuff coming out of ear".

## 2020-05-21 NOTE — Telephone Encounter (Signed)
Noted agree with evaluation.
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
## 2020-05-21 NOTE — Telephone Encounter (Signed)
Per chart review tab pt went to [facility] in [place]. 

## 2020-08-25 LAB — HM PAP SMEAR: HM Pap smear: NEGATIVE

## 2020-08-25 LAB — RESULTS CONSOLE HPV: CHL HPV: NEGATIVE

## 2020-12-23 ENCOUNTER — Encounter: Payer: Self-pay | Admitting: Family Medicine

## 2021-03-16 ENCOUNTER — Ambulatory Visit (INDEPENDENT_AMBULATORY_CARE_PROVIDER_SITE_OTHER): Payer: PRIVATE HEALTH INSURANCE | Admitting: Family Medicine

## 2021-03-16 ENCOUNTER — Ambulatory Visit (INDEPENDENT_AMBULATORY_CARE_PROVIDER_SITE_OTHER)
Admission: RE | Admit: 2021-03-16 | Discharge: 2021-03-16 | Disposition: A | Discharge status: Home / Self Care | Payer: PRIVATE HEALTH INSURANCE | Source: Ambulatory Visit | Attending: Family Medicine | Admitting: Family Medicine

## 2021-03-16 ENCOUNTER — Other Ambulatory Visit: Payer: Self-pay

## 2021-03-16 ENCOUNTER — Telehealth: Payer: Self-pay | Admitting: Family Medicine

## 2021-03-16 ENCOUNTER — Encounter: Payer: Self-pay | Admitting: Family Medicine

## 2021-03-16 ENCOUNTER — Ambulatory Visit (HOSPITAL_COMMUNITY)
Admission: RE | Admit: 2021-03-16 | Discharge: 2021-03-16 | Disposition: A | Discharge status: Home / Self Care | Payer: PRIVATE HEALTH INSURANCE | Source: Ambulatory Visit | Attending: Family Medicine | Admitting: Family Medicine

## 2021-03-16 VITALS — BP 120/60 | HR 83 | Temp 98.1°F | Ht 68.5 in | Wt 166.5 lb

## 2021-03-16 DIAGNOSIS — S161XXA Strain of muscle, fascia and tendon at neck level, initial encounter: Secondary | ICD-10-CM

## 2021-03-16 DIAGNOSIS — M47812 Spondylosis without myelopathy or radiculopathy, cervical region: Secondary | ICD-10-CM

## 2021-03-16 DIAGNOSIS — S161XXD Strain of muscle, fascia and tendon at neck level, subsequent encounter: Secondary | ICD-10-CM

## 2021-03-16 NOTE — Telephone Encounter (Signed)
Patient would like to see the following PT: ? ?Rachel Foster   ?[address] ?[place] ? ?[phone] ? ? ?She believes he is affiliated with [facility] ? ?
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
## 2021-03-16 NOTE — Patient Instructions (Signed)
#  Referral I have placed a referral to a specialist for you. You should receive a phone call from the specialty office. Make sure your voicemail is not full and that if you are able to answer your phone to unknown or new numbers.   It may take up to 2 weeks to hear about the referral. If you do not hear anything in 2 weeks, please call our office and ask to speak with the referral coordinator.   

## 2021-03-22 NOTE — Telephone Encounter (Signed)
Noted. Referral sent 

## 2021-03-28 ENCOUNTER — Telehealth: Payer: Self-pay

## 2021-03-28 NOTE — Telephone Encounter (Signed)
Patient called follow up on PT referral was told by office not received.  ? ?Should be going to ?Dr. Orpah Clinton in [place]  ?

## 2021-03-28 NOTE — Telephone Encounter (Addendum)
Referral cannot be sent electronically.  ? ?This will have to be printed and faxed as I cannot locate the correct information within Epic to fax this through [facility]. ? ?Please print referral and last OV note and fax to their office.  ? ?Curlene Dolphin, DPT ?[place] ?[address] [place], [place] [address] ?(P) [phone] ?(F) [phone] ? ?The patient can then call to schedule.  ? ?Thanks! ?

## 2021-03-30 NOTE — Telephone Encounter (Signed)
Referral and last OV notes faxed to number listed.  ?Pt made aware.  ?

## 2021-06-03 ENCOUNTER — Ambulatory Visit (HOSPITAL_COMMUNITY)
Admission: RE | Admit: 2021-06-03 | Discharge: 2021-06-03 | Disposition: A | Discharge status: Home / Self Care | Payer: PRIVATE HEALTH INSURANCE | Source: Ambulatory Visit | Attending: Family Medicine | Admitting: Family Medicine

## 2021-06-03 ENCOUNTER — Ambulatory Visit (INDEPENDENT_AMBULATORY_CARE_PROVIDER_SITE_OTHER): Payer: PRIVATE HEALTH INSURANCE | Admitting: Family Medicine

## 2021-06-03 VITALS — BP 80/60 | HR 68 | Temp 97.7°F | Wt 170.3 lb

## 2021-06-03 DIAGNOSIS — M7989 Other specified soft tissue disorders: Secondary | ICD-10-CM | POA: Insufficient documentation

## 2021-06-03 DIAGNOSIS — L659 Nonscarring hair loss, unspecified: Secondary | ICD-10-CM | POA: Diagnosis not present

## 2021-06-03 DIAGNOSIS — Z13220 Encounter for screening for lipoid disorders: Secondary | ICD-10-CM | POA: Diagnosis not present

## 2021-06-03 LAB — COMPREHENSIVE METABOLIC PANEL
ALT: 13 U/L (ref 0–35)
AST: 11 U/L (ref 0–37)
Albumin: 4.4 g/dL (ref 3.5–5.2)
Alkaline Phosphatase: 46 U/L (ref 39–117)
BUN: 17 mg/dL (ref 6–23)
CO2: 27 mEq/L (ref 19–32)
Calcium: 9.4 mg/dL (ref 8.4–10.5)
Chloride: 106 mEq/L (ref 96–112)
Creatinine, Ser: 0.87 mg/dL (ref 0.40–1.20)
GFR: 85.01 mL/min (ref >60.00)
Glucose, Bld: 96 mg/dL (ref 70–99)
Potassium: 4.3 mEq/L (ref 3.5–5.1)
Sodium: 139 mEq/L (ref 135–145)
Total Bilirubin: 0.6 mg/dL (ref 0.2–1.2)
Total Protein: 6.5 g/dL (ref 6.0–8.3)

## 2021-06-03 LAB — LIPID PANEL
Cholesterol: 175 mg/dL (ref 0–200)
HDL: 58.9 mg/dL (ref >39.00)
LDL Cholesterol: 105 mg/dL — ABNORMAL HIGH (ref 0–99)
NonHDL: 116.08
Total CHOL/HDL Ratio: 3
Triglycerides: 56 mg/dL (ref 0.0–149.0)
VLDL: 11.2 mg/dL (ref 0.0–40.0)

## 2021-06-03 LAB — IBC + FERRITIN
Ferritin: 11.8 ng/mL (ref 10.0–291.0)
Iron: 68 ug/dL (ref 42–145)
Saturation Ratios: 18.8 % — ABNORMAL LOW (ref 20.0–50.0)
TIBC: 362.6 ug/dL (ref 250.0–450.0)
Transferrin: 259 mg/dL (ref 212.0–360.0)

## 2021-06-03 LAB — CBC
HCT: 39.7 % (ref 36.0–46.0)
Hemoglobin: 13.1 g/dL (ref 12.0–15.0)
MCHC: 33.1 g/dL (ref 30.0–36.0)
MCV: 88.5 fl (ref 78.0–100.0)
Platelets: 194 10*3/uL (ref 150.0–400.0)
RBC: 4.49 Mil/uL (ref 3.87–5.11)
RDW: 13.7 % (ref 11.5–15.5)
WBC: 4.6 10*3/uL (ref 4.0–10.5)

## 2021-06-03 LAB — TSH: TSH: 2.9 u[IU]/mL (ref 0.35–5.50)

## 2021-06-03 NOTE — Progress Notes (Signed)
Subjective:     Deniece Jaliya Siegmann is a 36 y.o. female presenting for Pain (In R calf area and has now moved to behind thigh. X 2 months )     HPI  #Right leg pain - first in the calf - pain on the lower leg - then the pain moved to the upper thigh - some mild swelling in the right leg compared to the left - no change in exercise - started at rest - pain comes and goes and worse with prolonged sitting - started 2 months ago - has been traveling frequently since April - did have 4-5 hour flight - no sob, tachycardia  No OCP, no tobacco  Family hx of blood clots  #hair loss - coming in clumps - worse near the face - lashes are coming out too   Review of Systems   Social History   Tobacco Use  Smoking Status Never  Smokeless Tobacco Never        Objective:    BP Readings from Last 3 Encounters:  06/03/21 (!) 80/60  03/16/21 120/60  05/21/20 105/65   Wt Readings from Last 3 Encounters:  06/03/21 170 lb 5 oz (77.3 kg)  03/16/21 166 lb 8 oz (75.5 kg)  05/21/20 160 lb (72.6 kg)    BP (!) 80/60   Pulse 68   Temp 97.7 F (36.5 C) (Temporal)   Wt 170 lb 5 oz (77.3 kg)   LMP 05/15/2021 (Exact Date)   SpO2 97%   BMI 25.52 kg/m    Physical Exam Constitutional:      General: She is not in acute distress.    Appearance: She is well-developed. She is not diaphoretic.  HENT:     Right Ear: External ear normal.     Left Ear: External ear normal.     Nose: Nose normal.  Eyes:     Conjunctiva/sclera: Conjunctivae normal.  Cardiovascular:     Rate and Rhythm: Normal rate.  Pulmonary:     Effort: Pulmonary effort is normal.  Musculoskeletal:     Cervical back: Neck supple.     Comments: Right leg Inspection: slightly swelling compared to the left, no erythema Palpation: TTP along the posterior calf and knee ROM: normal Strength: normal No joint line TTP  Calf circumference is approximately 1 cm greater than the left  Skin:    General: Skin  is warm and dry.     Capillary Refill: Capillary refill takes less than 2 seconds.     Comments: Scalp with widows peak appearance of thinning hairline. Negative tug test. No rashes or skin changes on the scalp  Neurological:     Mental Status: She is alert. Mental status is at baseline.  Psychiatric:        Mood and Affect: Mood normal.        Behavior: Behavior normal.          Assessment & Plan:   Problem List Items Addressed This Visit       Other   Right leg swelling - Primary    Several recent airplane trips.  Pain occurred at rest without any history of trauma.  Family history of blood clots.  She is not currently on birth control and does not smoke.  Discussed option of D-dimer, but given that it is a Friday prior to a holiday weekend discussed that getting an ultrasound to fully rule out of DVT would be more reasonable.  Especially given her recent airline travel.  We will get ultrasound to evaluate for possible DVT.       Relevant Orders   US Venous Img Lower Unilateral Right (DVT)   Hair loss    She notes receding hairline, specifically loss of eyelashes as well.  She has started using Restasis for her eyes.  She is already supplementing with biotin.  Discussed getting labs today to evaluate for other causes.  If thyroid is abnormal we will repeat this with her off of biotin.  If labs are all normal may consider dermatology referral for optional treatment.       Relevant Orders   Comprehensive metabolic panel   CBC   IBC + Ferritin   TSH   Other Visit Diagnoses     Screening for hyperlipidemia       Relevant Orders   Lipid panel        Return if symptoms worsen or fail to improve.  Lynnda Child, MD

## 2021-06-03 NOTE — Assessment & Plan Note (Signed)
Several recent airplane trips.  Pain occurred at rest without any history of trauma.  Family history of blood clots.  She is not currently on birth control and does not smoke.  Discussed option of D-dimer, but given that it is a Friday prior to a holiday weekend discussed that getting an ultrasound to fully rule out of DVT would be more reasonable.  Especially given her recent airline travel.  We will get ultrasound to evaluate for possible DVT.

## 2021-06-03 NOTE — Patient Instructions (Signed)
Hair loss -- labs today -- continue biotin only if you feel it is helping  Someone will call about the ultrasound

## 2021-06-03 NOTE — Assessment & Plan Note (Signed)
She notes receding hairline, specifically loss of eyelashes as well.  She has started using Restasis for her eyes.  She is already supplementing with biotin.  Discussed getting labs today to evaluate for other causes.  If thyroid is abnormal we will repeat this with her off of biotin.  If labs are all normal may consider dermatology referral for optional treatment.

## 2021-06-03 NOTE — Addendum Note (Signed)
Addended by: Gweneth Dimitri R on: 06/03/2021 10:09 AM   Modules accepted: Orders

## 2023-12-28 ENCOUNTER — Other Ambulatory Visit: Payer: Self-pay | Admitting: Obstetrics and Gynecology

## 2023-12-28 DIAGNOSIS — R928 Other abnormal and inconclusive findings on diagnostic imaging of breast: Secondary | ICD-10-CM

## 2024-01-11 ENCOUNTER — Ambulatory Visit
Admission: RE | Admit: 2024-01-11 | Discharge: 2024-01-11 | Disposition: A | Discharge status: Home / Self Care | Payer: PRIVATE HEALTH INSURANCE | Source: Ambulatory Visit | Attending: Obstetrics and Gynecology | Admitting: Obstetrics and Gynecology

## 2024-01-11 DIAGNOSIS — R928 Other abnormal and inconclusive findings on diagnostic imaging of breast: Secondary | ICD-10-CM
